# Patient Record
Sex: Female | Born: 1980 | Race: Black or African American | Hispanic: No | Marital: Married | Smoking: Never smoker
Health system: Southern US, Community
[De-identification: ages and names within clinical notes are randomized; demographics above are authoritative.]

## PROBLEM LIST (undated history)

## (undated) DIAGNOSIS — Z436 Encounter for attention to other artificial openings of urinary tract: Secondary | ICD-10-CM

## (undated) DIAGNOSIS — K219 Gastro-esophageal reflux disease without esophagitis: Secondary | ICD-10-CM

## (undated) DIAGNOSIS — R768 Other specified abnormal immunological findings in serum: Secondary | ICD-10-CM

## (undated) DIAGNOSIS — N3010 Interstitial cystitis (chronic) without hematuria: Secondary | ICD-10-CM

## (undated) DIAGNOSIS — Z9889 Other specified postprocedural states: Secondary | ICD-10-CM

## (undated) DIAGNOSIS — G35 Multiple sclerosis: Secondary | ICD-10-CM

## (undated) HISTORY — PX: CHOLECYSTECTOMY: SHX55

## (undated) HISTORY — PX: OTHER SURGICAL HISTORY: SHX169

## (undated) HISTORY — PX: OVARIAN CYST DRAINAGE: SHX325

---

## 2017-07-20 HISTORY — PX: CYSTECTOMY W/ URETEROILEAL CONDUIT: SUR361

## 2021-01-03 ENCOUNTER — Emergency Department: Payer: BC Managed Care – PPO

## 2021-01-03 ENCOUNTER — Emergency Department
Admission: EM | Admit: 2021-01-03 | Discharge: 2021-01-03 | Disposition: A | Discharge status: Home / Self Care | Payer: BC Managed Care – PPO | Attending: Emergency Medicine | Admitting: Emergency Medicine

## 2021-01-03 ENCOUNTER — Encounter: Payer: Self-pay | Admitting: Emergency Medicine

## 2021-01-03 ENCOUNTER — Other Ambulatory Visit: Payer: Self-pay

## 2021-01-03 DIAGNOSIS — K8020 Calculus of gallbladder without cholecystitis without obstruction: Secondary | ICD-10-CM | POA: Diagnosis not present

## 2021-01-03 DIAGNOSIS — K2900 Acute gastritis without bleeding: Secondary | ICD-10-CM | POA: Diagnosis not present

## 2021-01-03 DIAGNOSIS — R109 Unspecified abdominal pain: Secondary | ICD-10-CM | POA: Diagnosis present

## 2021-01-03 HISTORY — DX: Encounter for attention to other artificial openings of urinary tract: Z43.6

## 2021-01-03 HISTORY — DX: Multiple sclerosis: G35

## 2021-01-03 HISTORY — DX: Interstitial cystitis (chronic) without hematuria: N30.10

## 2021-01-03 LAB — CBC
HCT: 36.9 % (ref 36.0–46.0)
Hemoglobin: 12.2 g/dL (ref 12.0–15.0)
MCH: 30.3 pg (ref 26.0–34.0)
MCHC: 33.1 g/dL (ref 30.0–36.0)
MCV: 91.8 fL (ref 80.0–100.0)
Platelets: 247 10*3/uL (ref 150–400)
RBC: 4.02 MIL/uL (ref 3.87–5.11)
RDW: 12.7 % (ref 11.5–15.5)
WBC: 4.7 10*3/uL (ref 4.0–10.5)
nRBC: 0 % (ref 0.0–0.2)

## 2021-01-03 LAB — COMPREHENSIVE METABOLIC PANEL
ALT: 9 U/L (ref 0–44)
AST: 20 U/L (ref 15–41)
Albumin: 4.5 g/dL (ref 3.5–5.0)
Alkaline Phosphatase: 41 U/L (ref 38–126)
Anion gap: 10 (ref 5–15)
BUN: 15 mg/dL (ref 6–20)
CO2: 23 mmol/L (ref 22–32)
Calcium: 9.4 mg/dL (ref 8.9–10.3)
Chloride: 105 mmol/L (ref 98–111)
Creatinine, Ser: 0.84 mg/dL (ref 0.44–1.00)
GFR, Estimated: >60 mL/min (ref >60)
Glucose, Bld: 81 mg/dL (ref 70–99)
Potassium: 3.9 mmol/L (ref 3.5–5.1)
Sodium: 138 mmol/L (ref 135–145)
Total Bilirubin: 0.6 mg/dL (ref 0.3–1.2)
Total Protein: 8.5 g/dL — ABNORMAL HIGH (ref 6.5–8.1)

## 2021-01-03 LAB — LIPASE, BLOOD: Lipase: 34 U/L (ref 11–51)

## 2021-01-03 MED ORDER — PANTOPRAZOLE SODIUM 20 MG PO TBEC: 20.0000 mg | DELAYED_RELEASE_TABLET | Freq: Every day | ORAL | 0 refills | Status: DC

## 2021-01-03 MED ORDER — SUCRALFATE 1 G PO TABS: 1.0000 g | ORAL_TABLET | Freq: Three times a day (TID) | ORAL | 0 refills | Status: DC

## 2021-01-03 MED ORDER — OXYCODONE HCL 5 MG PO TABS: 5.0000 mg | ORAL_TABLET | Freq: Three times a day (TID) | ORAL | 0 refills | Status: AC | PRN

## 2021-01-03 MED ORDER — ONDANSETRON 4 MG PO TBDP: 4.0000 mg | ORAL_TABLET | Freq: Three times a day (TID) | ORAL | 0 refills | Status: DC | PRN

## 2021-01-03 NOTE — Discharge Instructions (Addendum)
We are treating you for possible gastritis.  Please stop taking your ibuprofen.  Take Tylenol 1 g every 8 hours for your pain.  Take Zofran to help with nausea, Carafate to help line your stomach, Protonix to help reduce the acid.  I have prescribed a few oxycodone for severe breakthrough pain but he cannot drive or work while on these.  Take MiraLAX to prevent constipation.  You should call the number listed above on Monday for a follow-up appointment.  States that you were seen in the ER need follow-up approved by Dr. Lady Gary for evaluation of your gallbladder.  IMPRESSION:  1. Moderate cholelithiasis and tumefactive sludge without evidence  of acute cholecystitis.

## 2021-01-03 NOTE — ED Triage Notes (Signed)
Pt brought over from [facility] for gallstones. Pt has appointment in April but the pain continues to happen intermittently.

## 2021-01-03 NOTE — ED Provider Notes (Signed)
[facility] Emergency Department Provider Note  ____________________________________________   Event Date/Time   First MD Initiated Contact with Patient 01/03/21 1117     (approximate)  I have reviewed the triage vital signs and the nursing notes.   HISTORY  Chief Complaint Abdominal Pain    HPI Roberta Dean is a 40 y.o. female with MS, gallstones who comes in for abdominal pain.  Patient states she had 3 months of upper abdominal pain.  She states the pain was intermittent but now seems to be more consistent.  This is been going on for more consistently for about a week.  She denies it being related to food.  She denies ever having this issue previously.  She does have some nausea but no vomiting.  To note patient does have MS and does have a urostomy bag.  She was seen by urology for this pain and had a work-up done and they did not think it was related to her urostomy.  She has no lower abdominal pain.  She otherwise denies any fevers, chest pain, shortness of breath.  Patient does state that she has been on NSAIDs for years and she takes ibuprofen daily.          Past Medical History:  Diagnosis Date  . Attention to urostomy [facility])   . Interstitial cystitis   . Multiple sclerosis (HCC)     There are no problems to display for this patient.   History reviewed. No pertinent surgical history.  Prior to Admission medications   Not on File    Allergies Patient has no allergy information on record.  No family history on file.  Social History      Review of Systems Constitutional: No fever/chills Eyes: No visual changes. ENT: No sore throat. Cardiovascular: Denies chest pain. Respiratory: Denies shortness of breath. Gastrointestinal: Upper abdominal pain, nausea . Genitourinary: Negative for dysuria. Musculoskeletal: Negative for back pain. Skin: Negative for rash. Neurological: Negative for headaches, focal weakness or  numbness. All other ROS negative ____________________________________________   PHYSICAL EXAM:  VITAL SIGNS: ED Triage Vitals  Enc Vitals Group     BP 01/03/21 0921 114/63     Pulse Rate 01/03/21 0918 98     Resp 01/03/21 0918 20     Temp 01/03/21 0918 98.2 F (36.8 C)     Temp Source 01/03/21 0918 Oral     SpO2 01/03/21 0918 100 %     Weight 01/03/21 0918 103 lb (46.7 kg)     Height 01/03/21 0918 5\' 1"  (1.549 m)     Head Circumference --      Peak Flow --      Pain Score 01/03/21 0918 7     Pain Loc --      Pain Edu? --      Excl. in GC? --     Constitutional: Alert and oriented. Well appearing and in no acute distress. Eyes: Conjunctivae are normal. EOMI. Head: Atraumatic. Nose: No congestion/rhinnorhea. Mouth/Throat: Mucous membranes are moist.   Neck: No stridor. Trachea Midline. FROM Cardiovascular: Normal rate, regular rhythm. Grossly normal heart sounds.  Good peripheral circulation. Respiratory: Normal respiratory effort.  No retractions. Lungs CTAB. Gastrointestinal: Soft very mild epigastric tenderness no distention. No abdominal bruits.  Urostomy bag in place Musculoskeletal: No lower extremity tenderness nor edema.  No joint effusions. Neurologic:  Normal speech and language. No gross focal neurologic deficits are appreciated.  Skin:  Skin is warm, dry and intact. No rash  noted. Psychiatric: Mood and affect are normal. Speech and behavior are normal. GU: Deferred   ____________________________________________   LABS (all labs ordered are listed, but only abnormal results are displayed)  Labs Reviewed  COMPREHENSIVE METABOLIC PANEL - Abnormal; Notable for the following components:      Result Value   Total Protein 8.5 (*)    All other components within normal limits  LIPASE, BLOOD  CBC   ____________________________________________   RADIOLOGY  Official radiology report(s): US ABDOMEN LIMITED RUQ (LIVER/GB)  Result Date: 01/03/2021 CLINICAL  DATA:  Evaluate for cholelithiasis. Epigastric pain intermittently 3 months. EXAM: ULTRASOUND ABDOMEN LIMITED RIGHT UPPER QUADRANT COMPARISON:  None. FINDINGS: Gallbladder: Multiple shadowing stones and tumefactive sludge. No wall thickening or pericholecystic fluid. Negative sonographic Murphy sign. Largest stone measures 12 mm. Common bile duct: Diameter: 2.9 mm. Liver: No focal lesion identified. Within normal limits in parenchymal echogenicity. Portal vein is patent on color Doppler imaging with normal direction of blood flow towards the liver. Other: Incidental finding of a 2.6 cm cyst over the upper pole right kidney. IMPRESSION: 1. Moderate cholelithiasis and tumefactive sludge without evidence of acute cholecystitis. 2.  2.6 cm upper pole right renal cyst. Electronically Signed   By: Elberta Fortis M.D.   On: 01/03/2021 11:01    ____________________________________________   PROCEDURES  Procedure(s) performed (including Critical Care):  Procedures   ____________________________________________   INITIAL IMPRESSION / ASSESSMENT AND PLAN / ED COURSE  Roberta Dean was evaluated in Emergency Department on 01/03/2021 for the symptoms described in the history of present illness. She was evaluated in the context of the global COVID-19 pandemic, which necessitated consideration that the patient might be at risk for infection with the SARS-CoV-2 virus that causes COVID-19. Institutional protocols and algorithms that pertain to the evaluation of patients at risk for COVID-19 are in a state of rapid change based on information released by regulatory bodies including the CDC and federal and state organizations. These policies and algorithms were followed during the patient's care in the ED.     Patient is a 40 year old who comes in with epigastric abdominal pain is been going on for a few months now.  Patient does take NSAIDs daily.  She states that she has been on ibuprofen for years.  This could  be secondary to gastritis and we discussed treatment options for this including discontinuing NSAIDs started on Tylenol, Carafate, PPI and Zofran.  Ultrasound was obtained that did show evidence of gallstones.  There is no evidence of cholecystitis and LFTs are normal so low suspicion for choledocholithiasis.  Patient is very well-appearing at this time with normal vital signs no white count elevation to suggest infection.  We discussed seeing surgery today versus going home and trialing treatment for gastritis.  I think that it would be best to trial some gastritis treatment to see if this is resolving her issues given not a clear cut gallbladder description of pain.  I did discuss with Dr. Lady Gary who will schedule her in her clinic in 1 week.  I discussed with patient that if her symptoms are worsening she should return to the ER.  Her abdomen exam is very reassuring I have low suspicion for other acute abdominal process such as SBO, perforation.  She also has no chest pain and this has been going for some months I do not think that this is cardiac in nature      ____________________________________________   FINAL CLINICAL IMPRESSION(S) / ED DIAGNOSES   Final diagnoses:  Gallstone  Acute gastritis without hemorrhage, unspecified gastritis type      MEDICATIONS GIVEN DURING THIS VISIT:  Medications - No data to display   ED Discharge Orders         Ordered    sucralfate (CARAFATE) 1 g tablet  3 times daily with meals & bedtime        01/03/21 1137    ondansetron (ZOFRAN ODT) 4 MG disintegrating tablet  Every 8 hours PRN        01/03/21 1137    pantoprazole (PROTONIX) 20 MG tablet  Daily        01/03/21 1137    oxyCODONE (ROXICODONE) 5 MG immediate release tablet  Every 8 hours PRN        01/03/21 1137           Note:  This document was prepared using Dragon voice recognition software and may include unintentional dictation errors.   Concha Se, MD 01/03/21 1147

## 2021-01-13 ENCOUNTER — Other Ambulatory Visit: Payer: Self-pay

## 2021-01-13 ENCOUNTER — Encounter: Payer: Self-pay | Admitting: General Surgery

## 2021-01-13 ENCOUNTER — Ambulatory Visit (INDEPENDENT_AMBULATORY_CARE_PROVIDER_SITE_OTHER): Payer: BC Managed Care – PPO | Admitting: General Surgery

## 2021-01-13 VITALS — BP 121/75 | HR 87 | Temp 98.3°F | Ht 61.0 in | Wt 107.0 lb

## 2021-01-13 DIAGNOSIS — K2970 Gastritis, unspecified, without bleeding: Secondary | ICD-10-CM

## 2021-01-13 DIAGNOSIS — K2990 Gastroduodenitis, unspecified, without bleeding: Secondary | ICD-10-CM | POA: Diagnosis not present

## 2021-01-13 MED ORDER — PANTOPRAZOLE SODIUM 20 MG PO TBEC: 20.0000 mg | DELAYED_RELEASE_TABLET | Freq: Every day | ORAL | 1 refills | Status: DC

## 2021-01-13 MED ORDER — SUCRALFATE 1 G PO TABS: 1.0000 g | ORAL_TABLET | Freq: Three times a day (TID) | ORAL | 1 refills | Status: DC

## 2021-01-13 NOTE — Patient Instructions (Addendum)
Try eating several small meals a day instead of large meals.   Continue with your medication regimen, follow up with your primary care provider for any refills. Call us if you start to experience more symptoms and would like to consider gallbladder removal.   Gastritis, Adult Gastritis is inflammation of the stomach. There are two kinds of gastritis:  Acute gastritis. This kind develops suddenly.  Chronic gastritis. This kind is much more common and lasts for a long time. Gastritis happens when the lining of the stomach becomes weak or gets damaged. Without treatment, gastritis can lead to stomach bleeding and ulcers. What are the causes? This condition may be caused by:  An infection.  Drinking too much alcohol.  Certain medicines. These include steroids, antibiotics, and some over-the-counter medicines, such as aspirin or ibuprofen.  Having too much acid in the stomach.  A disease of the intestines or stomach.  Stress.  An allergic reaction.  Crohn's disease.  Some cancer treatments (radiation). Sometimes the cause of this condition is not known. What are the signs or symptoms? Symptoms of this condition include:  Pain or a burning sensation in the upper abdomen.  Nausea.  Vomiting.  An uncomfortable feeling of fullness after eating.  Weight loss.  Bad breath.  Blood in your vomit or stools. In some cases, there are no symptoms. How is this diagnosed? This condition may be diagnosed with:  Your medical history and a description of your symptoms.  A physical exam.  Tests. These can include: ? Blood tests. ? Stool tests. ? A test in which a thin, flexible instrument with a light and a camera is passed down the esophagus and into the stomach (upper endoscopy). ? A test in which a sample of tissue is taken for testing (biopsy). How is this treated? This condition may be treated with medicines. The medicines that are used vary depending on the cause of the  gastritis:  If the condition is caused by a bacterial infection, you may be given antibiotic medicines.  If the condition is caused by too much acid in the stomach, you may be given medicines called H2 blockers, proton pump inhibitors, or antacids. Treatment may also involve stopping the use of certain medicines, such as aspirin, ibuprofen, or other NSAIDs. Follow these instructions at home: Medicines  Take over-the-counter and prescription medicines only as told by your health care provider.  If you were prescribed an antibiotic medicine, take it as told by your health care provider. Do not stop taking the antibiotic even if you start to feel better. Eating and drinking  Eat small, frequent meals instead of large meals.  Avoid foods and drinks that make your symptoms worse.  Drink enough fluid to keep your urine pale yellow.   Alcohol use  Do not drink alcohol if: ? Your health care provider tells you not to drink. ? You are pregnant, may be pregnant, or are planning to become pregnant.  If you drink alcohol: ? Limit your use to:  0-1 drink a day for women.  0-2 drinks a day for men. ? Be aware of how much alcohol is in your drink. In the [place] one drink equals one 12 oz bottle of beer (355 mL), one 5 oz glass of wine (148 mL), or one 1 oz glass of hard liquor (44 mL). General instructions  Talk with your health care provider about ways to manage stress, such as getting regular exercise or practicing deep breathing, meditation, or yoga.  Do  not use any products that contain nicotine or tobacco, such as cigarettes and e-cigarettes. If you need help quitting, ask your health care provider.  Keep all follow-up visits as told by your health care provider. This is important. Contact a health care provider if:  Your symptoms get worse.  Your symptoms return after treatment. Get help right away if:  You vomit blood or material that looks like coffee grounds.  You have black  or dark red stools.  You are unable to keep fluids down.  Your abdominal pain gets worse.  You have a fever.  You do not feel better after one week. Summary  Gastritis is inflammation of the lining of the stomach that can occur suddenly (acute) or develop slowly over time (chronic).  This condition is diagnosed with a medical history, a physical exam, or tests.  This condition may be treated with medicines to treat infection or medicines to reduce the amount of acid in your stomach.  Follow your health care provider's instructions about taking medicines, making changes to your diet, and knowing when to call for help. This information is not intended to replace advice given to you by your health care provider. Make sure you discuss any questions you have with your health care provider. Document Revised: 04/25/2018 Document Reviewed: 04/25/2018 Elsevier Patient Education  2021 [employer].

## 2021-01-15 ENCOUNTER — Encounter: Payer: Self-pay | Admitting: General Surgery

## 2021-01-15 NOTE — Progress Notes (Signed)
Patient ID: Roberta Dean, female   DOB: 1981-12-11, 40 y.o.   MRN: [id number]  No chief complaint on file.   HPI Roberta Dean is a 40 y.o. female.   He is here today for follow-up from an emergency department visit of January 03, 2021.  She had presented with 3 months of upper abdominal pain.  There was no associated nausea or vomiting.  She says that the pain is worse when she does not eat, rather than being triggered by food.  She initially thought it might have been related to her ileal conduit, but was evaluated by urology and this was felt not to be the source.  She does take NSAIDs on a daily basis and has done so for many years.  Evaluation in the emergency department did include a right upper quadrant ultrasound.  This showed cholelithiasis and sludge, but no signs concerning for cholecystitis.  Labs obtained in the emergency department did not show any evidence of infection (normal white blood cell count) nor any suggestion of biliary obstruction.  Gastritis was felt to be on the differential diagnosis and she was prescribed pantoprazole and sucralfate.  She states that since initiating this medication regimen, her pain has improved.  She is here today for follow-up.  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  This was done at [facility].   Past Medical History:  Diagnosis Date  . Attention to urostomy [facility])   . Interstitial cystitis   . Multiple sclerosis (HCC)     Past Surgical History:  Procedure Laterality Date  . CYSTECTOMY W/ URETEROILEAL CONDUIT  07/2017   [facility]    Family History  Problem Relation Age of Onset  . Hypertension Mother   . Lung cancer Father 75    Social History Social History   Tobacco Use  . Smoking status: Never Smoker  . Smokeless tobacco: Never Used  Vaping Use  . Vaping Use: Never used  Substance Use  Topics  . Alcohol use: Never  . Drug use: Never    No Known Allergies  Current Outpatient Medications  Medication Sig Dispense Refill  . ergocalciferol (VITAMIN D2) 1.25 MG (50000 UT) capsule Take by mouth.    . gabapentin (NEURONTIN) 300 MG capsule TAKE 1 TO 2 CAPSULES BY MOUTH AT BEDTIME    . nitrofurantoin (MACRODANTIN) 50 MG capsule Take 50 mg by mouth at bedtime.    . ondansetron (ZOFRAN ODT) 4 MG disintegrating tablet Take 1 tablet (4 mg total) by mouth every 8 (eight) hours as needed for nausea or vomiting. 20 tablet 0  . pantoprazole (PROTONIX) 20 MG tablet Take 1 tablet (20 mg total) by mouth daily. 30 tablet 1  . sucralfate (CARAFATE) 1 g tablet Take 1 tablet (1 g total) by mouth 4 (four) times daily -  with meals and at bedtime. 120 tablet 1   No current facility-administered medications for this visit.    Review of Systems Review of Systems  Constitutional: Positive for unexpected weight change.  HENT: Positive for tinnitus.   Gastrointestinal: Positive for abdominal pain and constipation.       GERD  Neurological:       Multiple sclerosis  All other systems reviewed and are negative.   Blood pressure 121/75, pulse 87, temperature 98.3 F (36.8 C), height 5\' 1"  (1.549 m), weight 107 lb (48.5 kg), last menstrual period 12/17/2020, SpO2 98 %.  Body mass index is 20.22 kg/m.  Physical Exam Physical Exam Constitutional:      General: She is not in acute distress.    Appearance: Normal appearance.     Comments: Extremely petite and thin.  HENT:     Head: Normocephalic and atraumatic.     Nose:     Comments: Covered with a mask    Mouth/Throat:     Comments: Covered with a mask Eyes:     General: No scleral icterus.       Right eye: No discharge.        Left eye: No discharge.     Conjunctiva/sclera: Conjunctivae normal.  Neck:     Comments: No palpable cervical or supraclavicular lymphadenopathy.  The trachea is midline.  There are no dominant thyroid masses  or thyromegaly appreciated.  The gland moves freely with deglutition. Cardiovascular:     Rate and Rhythm: Normal rate and regular rhythm.     Pulses: Normal pulses.  Pulmonary:     Effort: Pulmonary effort is normal.     Breath sounds: Normal breath sounds.  Abdominal:     General: Abdomen is flat.     Palpations: Abdomen is soft.       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  Clear yellow urine.  There is mild tenderness to deep palpation in the epigastric area.  Murphy sign is negative.  Genitourinary:    Comments: Deferred Musculoskeletal:        General: No swelling or tenderness.  Skin:    General: Skin is warm and dry.  Neurological:     General: No focal deficit present.     Mental Status: She is alert and oriented to person, place, and time.  Psychiatric:        Mood and Affect: Mood normal.        Behavior: Behavior normal.     Data Reviewed Results for Roberta Dean (MRN [id number]) as of 01/15/2021 13:14  Ref. Range 01/03/2021 09:26  COMPREHENSIVE METABOLIC PANEL Unknown Rpt (A)  Sodium Latest Ref Range: 135 - 145 mmol/L 138  Potassium Latest Ref Range: 3.5 - 5.1 mmol/L 3.9  Chloride Latest Ref Range: 98 - 111 mmol/L 105  CO2 Latest Ref Range: 22 - 32 mmol/L 23  Glucose Latest Ref Range: 70 - 99 mg/dL 81  BUN Latest Ref Range: 6 - 20 mg/dL 15  Creatinine Latest Ref Range: 0.44 - 1.00 mg/dL 3.38  Calcium Latest Ref Range: 8.9 - 10.3 mg/dL 9.4  Anion gap Latest Ref Range: 5 - 15  10  Alkaline Phosphatase Latest Ref Range: 38 - 126 U/L 41  Albumin Latest Ref Range: 3.5 - 5.0 g/dL 4.5  Lipase Latest Ref Range: 11 - 51 U/L 34  AST Latest Ref Range: 15 - 41 U/L 20  ALT Latest Ref Range: 0 - 44 U/L 9  Total Protein Latest Ref Range: 6.5 - 8.1 g/dL 8.5 (H)  Total Bilirubin Latest Ref Range: 0.3 - 1.2 mg/dL 0.6  GFR, Estimated Latest Ref Range: >60 mL/min >60  WBC Latest Ref Range: 4.0 - 10.5 K/uL 4.7  RBC Latest Ref Range: 3.87 -  5.11 MIL/uL 4.02  Hemoglobin Latest Ref Range: 12.0 - 15.0 g/dL 25.0  HCT Latest Ref Range: 36.0 - 46.0 % 36.9  MCV Latest Ref Range: 80.0 - 100.0 fL 91.8  MCH Latest Ref Range: 26.0 - 34.0 pg 30.3  MCHC Latest Ref Range: 30.0 - 36.0 g/dL 33.1  RDW Latest Ref Range: 11.5 - 15.5 % 12.7  Platelets Latest Ref Range: 150 - 400 K/uL 247  nRBC Latest Ref Range: 0.0 - 0.2 % 0.0  As discussed in the history of present illness.  These labs are normal and do not suggest biliary obstruction, hepatic inflammation, or any concern for infection.  I reviewed the ultrasound performed in the emergency room and concur with the radiology interpretation copied here:  CLINICAL DATA:  Evaluate for cholelithiasis. Epigastric pain intermittently 3 months.  EXAM: ULTRASOUND ABDOMEN LIMITED RIGHT UPPER QUADRANT  COMPARISON:  None.  FINDINGS: Gallbladder:  Multiple shadowing stones and tumefactive sludge. No wall thickening or pericholecystic fluid. Negative sonographic Murphy sign. Largest stone measures 12 mm.  Common bile duct:  Diameter: 2.9 mm.  Liver:  No focal lesion identified. Within normal limits in parenchymal echogenicity. Portal vein is patent on color Doppler imaging with normal direction of blood flow towards the liver.  Other: Incidental finding of a 2.6 cm cyst over the upper pole right kidney.  IMPRESSION: 1. Moderate cholelithiasis and tumefactive sludge without evidence of acute cholecystitis.  2.  2.6 cm upper pole right renal cyst.  Assessment This is a 40 year old woman with past medical history notable for cystectomy with ileal conduit creation, as well as multiple sclerosis.  Her epigastric pain seems more consistent with NSAID related gastritis versus peptic ulcer disease, given it is worse when she does not eat and has improved with addition of a proton pump inhibitor and sucralfate.  While she does have sludge and stones in her gallbladder, the history and  her symptoms are not suggestive of symptomatic cholelithiasis or biliary dyskinesia.  There is no evidence of cholecystitis on her imaging, nor on exam.    Plan For now, she wishes to defer any surgical intervention.  I gave her a refill on her pantoprazole and sucralfate.  She should continue to avoid NSAIDs and aspirin.  She may benefit from upper endoscopy, but at this time we have not placed that referral.  If she continues to have symptoms, I think this would likely be the first place to start in evaluating the source of her discomfort, however, should she develop symptoms more consistent with a gallbladder process, we are certainly willing to see her again to discuss surgical intervention.  Of note, the presence of the ileal conduit may complicate things and make an operative approach somewhat more challenging, but I think we will be able to work around this, should it become pertinent.    Duanne Guess 01/15/2021, 9:04 AM

## 2021-08-07 ENCOUNTER — Ambulatory Visit
Admission: RE | Admit: 2021-08-07 | Discharge: 2021-08-07 | Disposition: A | Discharge status: Home / Self Care | Payer: BC Managed Care – PPO | Attending: Gastroenterology | Admitting: Gastroenterology

## 2021-08-07 ENCOUNTER — Ambulatory Visit: Payer: BC Managed Care – PPO | Admitting: Anesthesiology

## 2021-08-07 ENCOUNTER — Encounter: Payer: Self-pay | Admitting: *Deleted

## 2021-08-07 ENCOUNTER — Encounter
Admission: RE | Disposition: A | Discharge status: Home / Self Care | Payer: Self-pay | Source: Home / Self Care | Attending: Gastroenterology

## 2021-08-07 DIAGNOSIS — Z791 Long term (current) use of non-steroidal anti-inflammatories (NSAID): Secondary | ICD-10-CM | POA: Diagnosis not present

## 2021-08-07 DIAGNOSIS — K219 Gastro-esophageal reflux disease without esophagitis: Secondary | ICD-10-CM | POA: Diagnosis not present

## 2021-08-07 DIAGNOSIS — R1013 Epigastric pain: Secondary | ICD-10-CM | POA: Insufficient documentation

## 2021-08-07 DIAGNOSIS — K5900 Constipation, unspecified: Secondary | ICD-10-CM | POA: Diagnosis not present

## 2021-08-07 DIAGNOSIS — K2950 Unspecified chronic gastritis without bleeding: Secondary | ICD-10-CM | POA: Diagnosis not present

## 2021-08-07 DIAGNOSIS — Z79899 Other long term (current) drug therapy: Secondary | ICD-10-CM | POA: Diagnosis not present

## 2021-08-07 DIAGNOSIS — G35 Multiple sclerosis: Secondary | ICD-10-CM | POA: Insufficient documentation

## 2021-08-07 HISTORY — DX: Gastro-esophageal reflux disease without esophagitis: K21.9

## 2021-08-07 HISTORY — PX: ESOPHAGOGASTRODUODENOSCOPY (EGD) WITH PROPOFOL: SHX5813

## 2021-08-07 LAB — POCT PREGNANCY, URINE: Preg Test, Ur: NEGATIVE

## 2021-08-07 SURGERY — ESOPHAGOGASTRODUODENOSCOPY (EGD) WITH PROPOFOL
Anesthesia: General

## 2021-08-07 MED ORDER — PROPOFOL 10 MG/ML IV BOLUS
INTRAVENOUS | Status: DC | PRN
  Administered 2021-08-07: 30 mg via INTRAVENOUS
  Administered 2021-08-07: 70 mg via INTRAVENOUS

## 2021-08-07 MED ORDER — SODIUM CHLORIDE 0.9 % IV SOLN: INTRAVENOUS | Status: DC

## 2021-08-07 MED ORDER — PROPOFOL 500 MG/50ML IV EMUL
INTRAVENOUS | Status: DC | PRN
  Administered 2021-08-07: 150 ug/kg/min via INTRAVENOUS

## 2021-08-07 MED ORDER — ONDANSETRON HCL 4 MG/2ML IJ SOLN
INTRAMUSCULAR | Status: DC | PRN
  Administered 2021-08-07: 4 mg via INTRAVENOUS

## 2021-08-07 MED ORDER — DEXMEDETOMIDINE (PRECEDEX) IN NS 20 MCG/5ML (4 MCG/ML) IV SYRINGE
PREFILLED_SYRINGE | INTRAVENOUS | Status: DC | PRN
  Administered 2021-08-07: 12 ug via INTRAVENOUS

## 2021-08-07 MED ORDER — LIDOCAINE HCL (CARDIAC) PF 100 MG/5ML IV SOSY
PREFILLED_SYRINGE | INTRAVENOUS | Status: DC | PRN
  Administered 2021-08-07: 50 mg via INTRAVENOUS

## 2021-08-07 NOTE — H&P (Signed)
Outpatient short stay form Pre-procedure 08/07/2021  Regis Bill, MD  Primary Physician: Lorenso Quarry, NP  Reason for visit:  Epigastric pain  History of present illness:   40 y/o lady with MS here for epigastric pain. History of ileal conduit. No blood thinners. On NSAIDS previously. Pain feels constant now. Worse when she doesn't eat. Has issues with constipation.    Current Facility-Administered Medications:    0.9 %  sodium chloride infusion, , Intravenous, Continuous, Ly Bacchi, Rossie Muskrat, MD, Last Rate: 20 mL/hr at 08/07/21 1239, New Bag at 08/07/21 1239  Medications Prior to Admission  Medication Sig Dispense Refill Last Dose   calcium-vitamin D (OSCAL WITH D) 500-200 MG-UNIT tablet Take 1 tablet by mouth.      cyanocobalamin 1000 MCG tablet Take 1,000 mcg by mouth daily.      dicyclomine (BENTYL) 10 MG/5ML solution Take by mouth 4 (four) times daily -  before meals and at bedtime.      ibuprofen (ADVIL) 200 MG tablet Take 200 mg by mouth every 6 (six) hours as needed.   08/06/2021   linaclotide (LINZESS) 72 MCG capsule Take 72 mcg by mouth daily before breakfast.      ergocalciferol (VITAMIN D2) 1.25 MG (50000 UT) capsule Take by mouth.      gabapentin (NEURONTIN) 300 MG capsule TAKE 1 TO 2 CAPSULES BY MOUTH AT BEDTIME      nitrofurantoin (MACRODANTIN) 50 MG capsule Take 50 mg by mouth at bedtime.      ondansetron (ZOFRAN ODT) 4 MG disintegrating tablet Take 1 tablet (4 mg total) by mouth every 8 (eight) hours as needed for nausea or vomiting. 20 tablet 0    pantoprazole (PROTONIX) 20 MG tablet Take 1 tablet (20 mg total) by mouth daily. 30 tablet 1    sucralfate (CARAFATE) 1 g tablet Take 1 tablet (1 g total) by mouth 4 (four) times daily -  with meals and at bedtime. 120 tablet 1      No Known Allergies   Past Medical History:  Diagnosis Date   Attention to urostomy [facility])    GERD (gastroesophageal reflux disease)    Interstitial cystitis    Multiple sclerosis  (HCC)     Review of systems:  Otherwise negative.    Physical Exam  Gen: Alert, oriented. Appears stated age.  HEENT: PERRLA. Lungs: No respiratory distress CV: RRR Abd: soft, benign, no masses Ext: No edema    Planned procedures: Proceed with EGD. The patient understands the nature of the planned procedure, indications, risks, alternatives and potential complications including but not limited to bleeding, infection, perforation, damage to internal organs and possible oversedation/side effects from anesthesia. The patient agrees and gives consent to proceed.  Please refer to procedure notes for findings, recommendations and patient disposition/instructions.     Regis Bill, MD [facility]

## 2021-08-07 NOTE — Anesthesia Preprocedure Evaluation (Signed)
Anesthesia Evaluation  Patient identified by MRN, date of birth, ID band Patient awake    Reviewed: Allergy & Precautions, NPO status , Patient's Chart, lab work & pertinent test results  History of Anesthesia Complications Negative for: history of anesthetic complications  Airway Mallampati: III  TM Distance: >3 FB Neck ROM: full    Dental  (+) Chipped   Pulmonary neg pulmonary ROS, neg shortness of breath,    Pulmonary exam normal        Cardiovascular Exercise Tolerance: Good negative cardio ROS Normal cardiovascular exam     Neuro/Psych  Neuromuscular disease negative psych ROS   GI/Hepatic Neg liver ROS, GERD  Medicated and Controlled,  Endo/Other  negative endocrine ROS  Renal/GU negative Renal ROS  negative genitourinary   Musculoskeletal   Abdominal   Peds  Hematology negative hematology ROS (+)   Anesthesia Other Findings Past Medical History: No date: Attention to urostomy [facility]) No date: GERD (gastroesophageal reflux disease) No date: Interstitial cystitis No date: Multiple sclerosis [facility])  Past Surgical History: 07/2017: CYSTECTOMY W/ URETEROILEAL CONDUIT     Comment:  [facility]  BMI    Body Mass Index: 19.65 kg/m      Reproductive/Obstetrics negative OB ROS                             Anesthesia Physical Anesthesia Plan  ASA: 3  Anesthesia Plan: General   Post-op Pain Management:    Induction: Intravenous  PONV Risk Score and Plan: Propofol infusion and TIVA  Airway Management Planned: Natural Airway and Nasal Cannula  Additional Equipment:   Intra-op Plan:   Post-operative Plan:   Informed Consent: I have reviewed the patients History and Physical, chart, labs and discussed the procedure including the risks, benefits and alternatives for the proposed anesthesia with the patient or authorized representative who has indicated  his/her understanding and acceptance.     Dental Advisory Given  Plan Discussed with: Anesthesiologist, CRNA and Surgeon  Anesthesia Plan Comments: (Patient consented for risks of anesthesia including but not limited to:  - adverse reactions to medications - risk of airway placement if required - damage to eyes, teeth, lips or other oral mucosa - nerve damage due to positioning  - sore throat or hoarseness - Damage to heart, brain, nerves, lungs, other parts of body or loss of life  Patient voiced understanding.)        Anesthesia Quick Evaluation

## 2021-08-07 NOTE — Transfer of Care (Signed)
Immediate Anesthesia Transfer of Care Note  Patient: Roberta Dean  Procedure(s) Performed: ESOPHAGOGASTRODUODENOSCOPY (EGD) WITH PROPOFOL  Patient Location: PACU  Anesthesia Type:General  Level of Consciousness: drowsy  Airway & Oxygen Therapy: Patient Spontanous Breathing  Post-op Assessment: Report given to RN and Post -op Vital signs reviewed and stable  Post vital signs: Reviewed and stable  Last Vitals:  Vitals Value Taken Time  BP 110/74 08/07/21 1300  Temp 36.7 C 08/07/21 1226  Pulse 84 08/07/21 1300  Resp 20 08/07/21 1300  SpO2 100 % 08/07/21 1300    Last Pain:  Vitals:   08/07/21 1226  TempSrc: Temporal  PainSc: 8          Complications: No notable events documented.

## 2021-08-07 NOTE — Anesthesia Procedure Notes (Signed)
Date/Time: 08/07/2021 12:44 PM Performed by: Ginger Carne, CRNA Pre-anesthesia Checklist: Patient identified, Emergency Drugs available, Suction available and Patient being monitored Patient Re-evaluated:Patient Re-evaluated prior to induction Oxygen Delivery Method: Nasal cannula Preoxygenation: Pre-oxygenation with 100% oxygen Induction Type: IV induction

## 2021-08-07 NOTE — Interval H&P Note (Signed)
History and Physical Interval Note:  08/07/2021 12:41 PM  Roberta Dean  has presented today for surgery, with the diagnosis of UPPER ABD PAIN.  The various methods of treatment have been discussed with the patient and family. After consideration of risks, benefits and other options for treatment, the patient has consented to  Procedure(s): ESOPHAGOGASTRODUODENOSCOPY (EGD) WITH PROPOFOL (N/A) as a surgical intervention.  The patient's history has been reviewed, patient examined, no change in status, stable for surgery.  I have reviewed the patient's chart and labs.  Questions were answered to the patient's satisfaction.     Regis Bill  Ok to proceed with EGD

## 2021-08-07 NOTE — Op Note (Signed)
[facility] Patient Name: Roberta Dean Procedure Date: 08/07/2021 12:42 PM MRN: [id number] Account #: [id number] Date of Birth: Aug 29, 1981 Admit Type: Outpatient Age: 40 Room: [facility] ENDO ROOM 3 Gender: Female Note Status: Finalized Procedure:             Upper GI endoscopy Indications:           Epigastric abdominal pain Providers:             Andrey Farmer MD, MD Referring MD:          No Local Md, MD (Referring MD) Medicines:             Monitored Anesthesia Care Complications:         No immediate complications. Estimated blood loss:                         Minimal. Procedure:             Pre-Anesthesia Assessment:                        - Prior to the procedure, a History and Physical was                         performed, and patient medications and allergies were                         reviewed. The patient is competent. The risks and                         benefits of the procedure and the sedation options and                         risks were discussed with the patient. All questions                         were answered and informed consent was obtained.                         Patient identification and proposed procedure were                         verified by the physician, the nurse, the anesthetist                         and the technician in the endoscopy suite. Mental                         Status Examination: alert and oriented. Airway                         Examination: normal oropharyngeal airway and neck                         mobility. Respiratory Examination: clear to                         auscultation. CV Examination: normal. Prophylactic  Antibiotics: The patient does not require prophylactic                         antibiotics. Prior Anticoagulants: The patient has                         taken no previous anticoagulant or antiplatelet                         agents. ASA Grade  Assessment: III - A patient with                         severe systemic disease. After reviewing the risks and                         benefits, the patient was deemed in satisfactory                         condition to undergo the procedure. The anesthesia                         plan was to use monitored anesthesia care (MAC).                         Immediately prior to administration of medications,                         the patient was re-assessed for adequacy to receive                         sedatives. The heart rate, respiratory rate, oxygen                         saturations, blood pressure, adequacy of pulmonary                         ventilation, and response to care were monitored                         throughout the procedure. The physical status of the                         patient was re-assessed after the procedure.                        After obtaining informed consent, the endoscope was                         passed under direct vision. Throughout the procedure,                         the patient's blood pressure, pulse, and oxygen                         saturations were monitored continuously. The Endoscope                         was introduced through the mouth, and advanced to the  second part of duodenum. The upper GI endoscopy was                         accomplished without difficulty. The patient tolerated                         the procedure well. Findings:      The examined esophagus was normal.      The entire examined stomach was normal. Biopsies were taken with a cold       forceps for Helicobacter pylori testing. Estimated blood loss was       minimal.      The examined duodenum was normal. Impression:            - Normal esophagus.                        - Normal stomach. Biopsied.                        - Normal examined duodenum. Recommendation:        - Discharge patient to home.                        - Resume  previous diet.                        - Continue present medications.                        - Await pathology results.                        - Return to referring physician as previously                         scheduled. Procedure Code(s):     --- Professional ---                        [phone], Esophagogastroduodenoscopy, flexible,                         transoral; with biopsy, single or multiple Diagnosis Code(s):     --- Professional ---                        R10.13, Epigastric pain CPT copyright 2019 [employer]. All rights reserved. The codes documented in this report are preliminary and upon coder review may  be revised to meet current compliance requirements. Andrey Farmer MD, MD 08/07/2021 12:55:32 PM Number of Addenda: 0 Note Initiated On: 08/07/2021 12:42 PM Estimated Blood Loss:  Estimated blood loss was minimal.      [facility]

## 2021-08-10 ENCOUNTER — Encounter: Payer: Self-pay | Admitting: Gastroenterology

## 2021-08-10 LAB — SURGICAL PATHOLOGY

## 2021-08-10 NOTE — Anesthesia Postprocedure Evaluation (Signed)
Anesthesia Post Note  Patient: Roberta Dean  Procedure(s) Performed: ESOPHAGOGASTRODUODENOSCOPY (EGD) WITH PROPOFOL  Patient location during evaluation: Endoscopy Anesthesia Type: General Level of consciousness: awake and alert Pain management: pain level controlled Vital Signs Assessment: post-procedure vital signs reviewed and stable Respiratory status: spontaneous breathing, nonlabored ventilation, respiratory function stable and patient connected to nasal cannula oxygen Cardiovascular status: blood pressure returned to baseline and stable Postop Assessment: no apparent nausea or vomiting Anesthetic complications: no   No notable events documented.   Last Vitals:  Vitals:   08/07/21 1320 08/07/21 1330  BP: 106/68 98/71  Pulse: 76 74  Resp: 12 10  Temp:    SpO2: 100% 100%    Last Pain:  Vitals:   08/09/21 1343  TempSrc:   PainSc: 0-No pain                 Cleda Mccreedy Debany Vantol

## 2021-09-15 ENCOUNTER — Encounter: Payer: Self-pay | Admitting: General Surgery

## 2022-04-05 ENCOUNTER — Other Ambulatory Visit: Payer: Self-pay | Admitting: Student

## 2022-04-05 DIAGNOSIS — G8929 Other chronic pain: Secondary | ICD-10-CM

## 2022-09-27 IMAGING — US US ABDOMEN LIMITED
1 series · 14 of 25 positions shown · non-contrast
Comparison: None.

CLINICAL DATA: Evaluate for cholelithiasis. Epigastric pain
intermittently 3 months.

EXAM:
ULTRASOUND ABDOMEN LIMITED RIGHT UPPER QUADRANT

[Series 1: us abdomen limited ruq (liver/gb) · 14 of 62 slices shown]
[im 1/62]
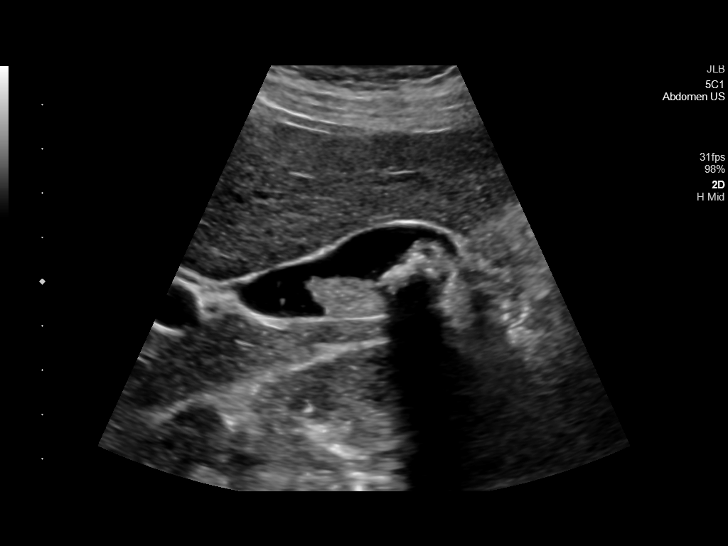
[im 6/62]
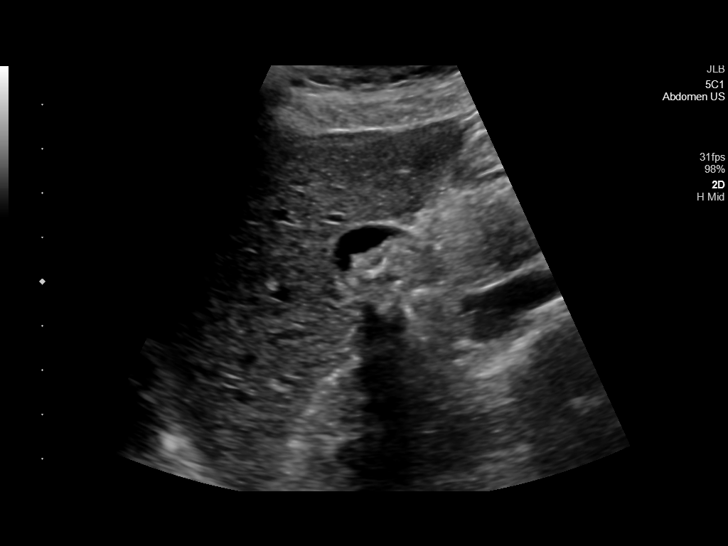
[im 11/62]
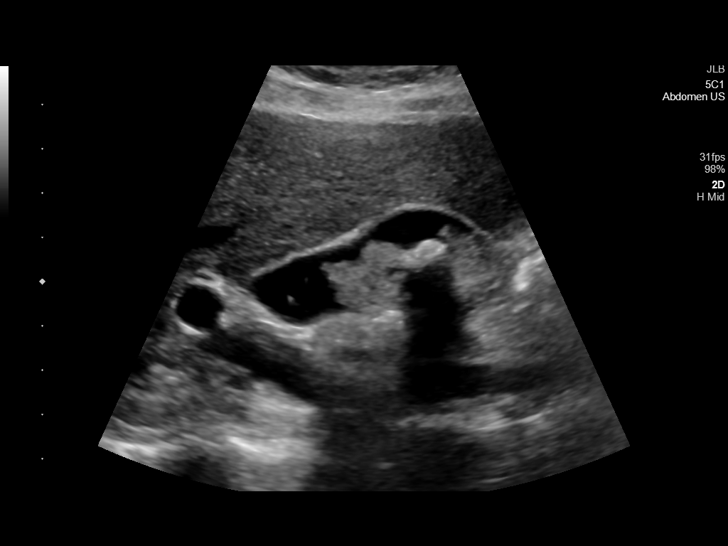
[im 16/62]
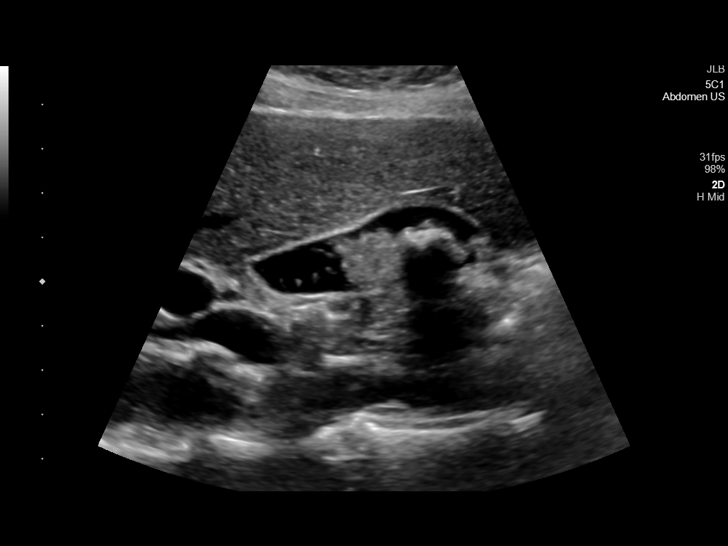
[im 21/62]
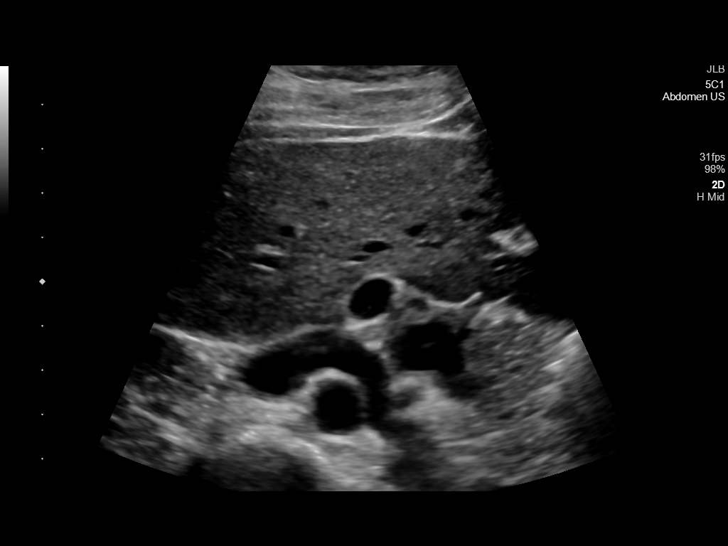
[im 23/62]
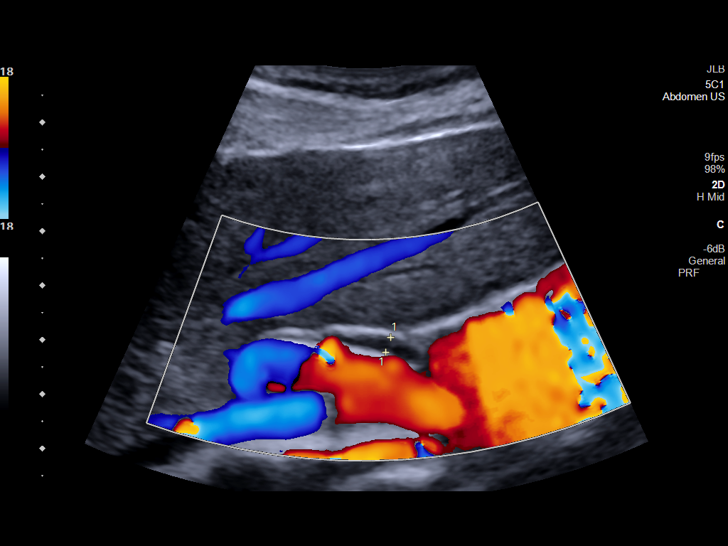
[im 28/62]
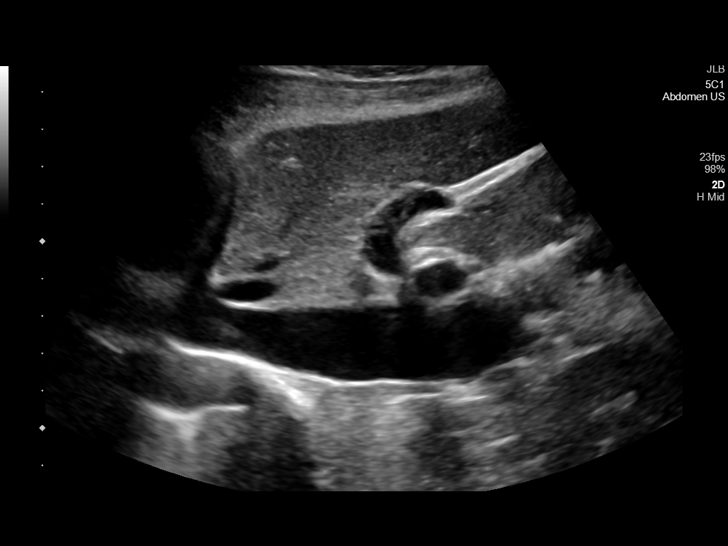
[im 34/62]
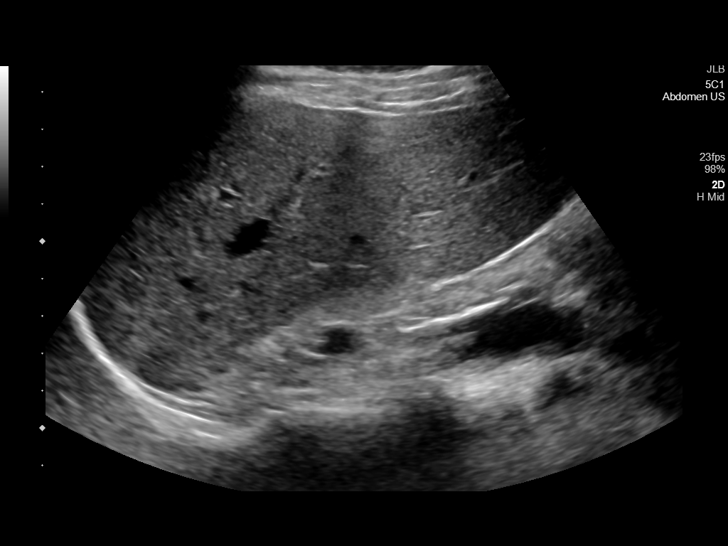
[im 39/62]
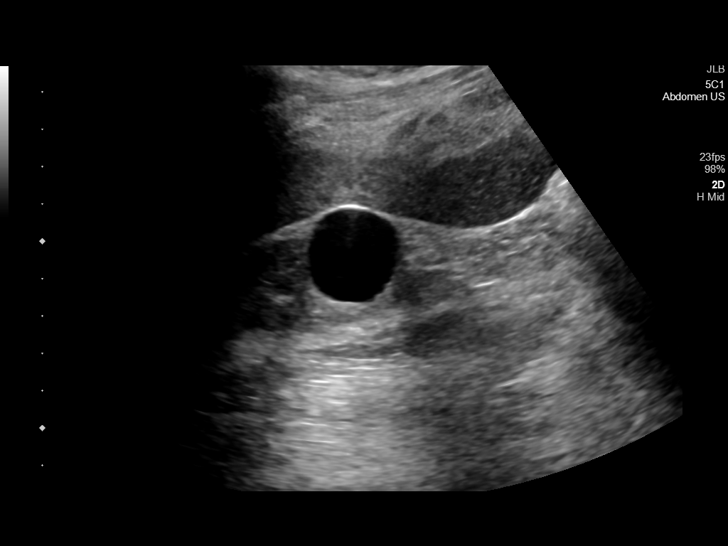
[im 41/62]
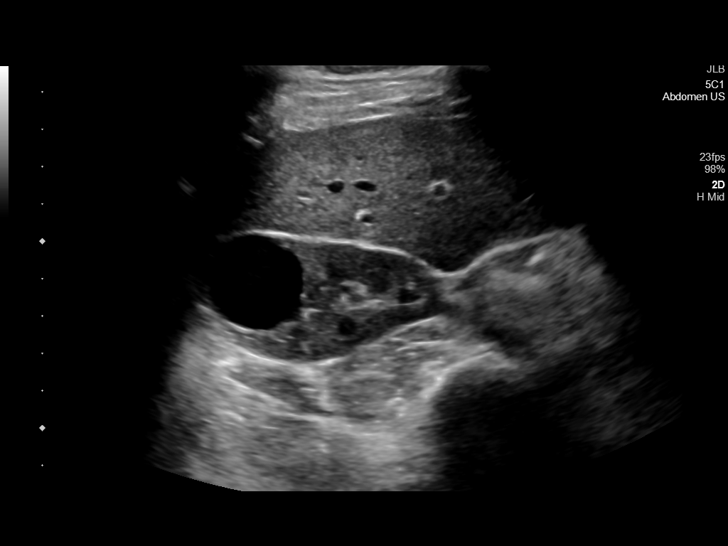
[im 46/62]
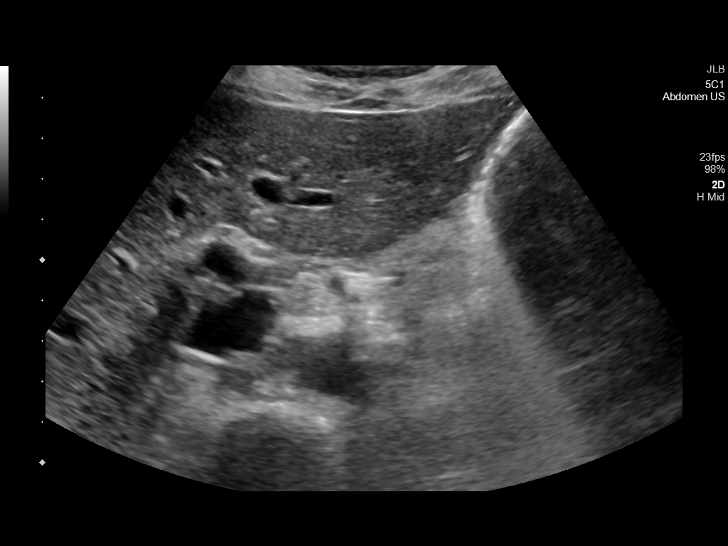
[im 51/62]
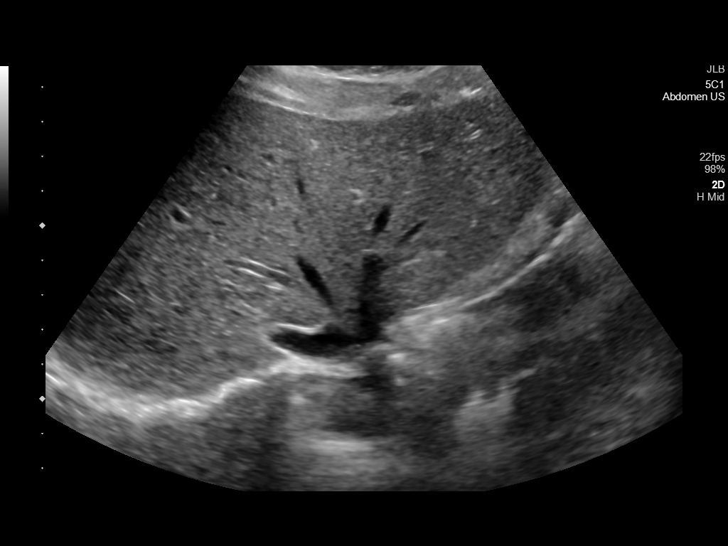
[im 56/62]
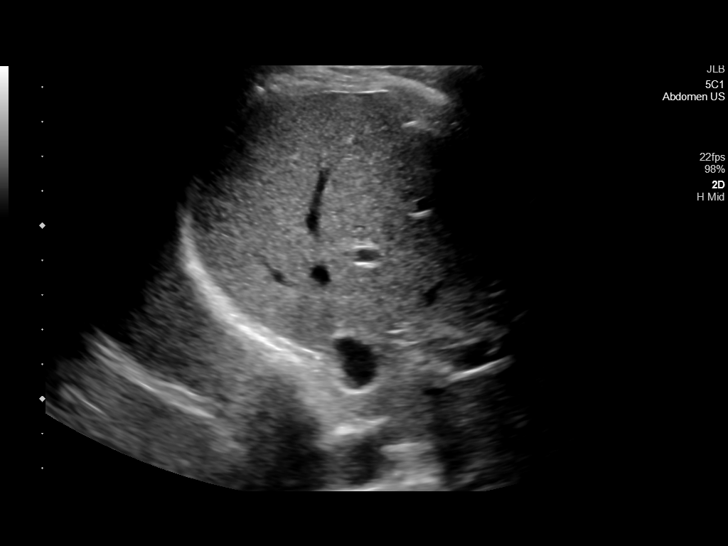
[im 62/62]
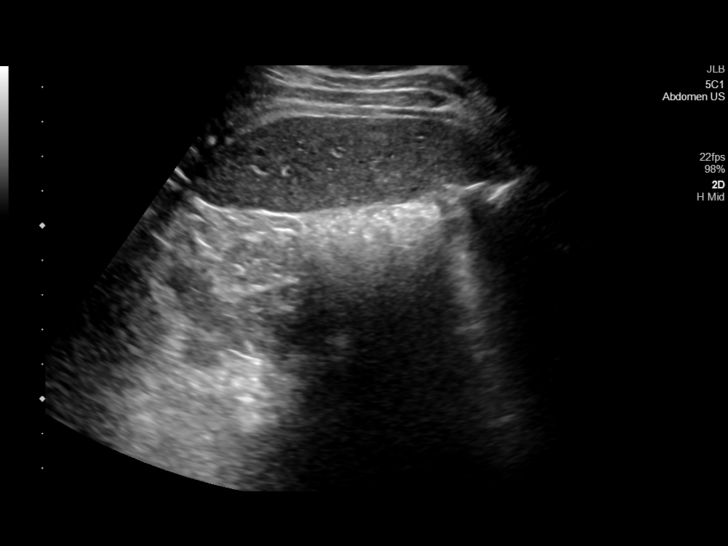

[14 of 25 positions shown; findings below may reference images not displayed]

FINDINGS: Gallbladder:

Multiple shadowing stones and tumefactive sludge. No wall thickening
or pericholecystic fluid. Negative sonographic Murphy sign. Largest
stone measures 12 mm.

Common bile duct:

Diameter: 2.9 mm.

Liver:

No focal lesion identified. Within normal limits in parenchymal
echogenicity. Portal vein is patent on color Doppler imaging with
normal direction of blood flow towards the liver.

Other: Incidental finding of a 2.6 cm cyst over the upper pole right
kidney.
IMPRESSION: 1. Moderate cholelithiasis and tumefactive sludge without evidence
of acute cholecystitis.

2.  2.6 cm upper pole right renal cyst.

## 2023-07-19 ENCOUNTER — Other Ambulatory Visit: Payer: Self-pay | Admitting: Student

## 2023-07-19 DIAGNOSIS — G35 Multiple sclerosis: Secondary | ICD-10-CM

## 2023-07-23 ENCOUNTER — Ambulatory Visit
Admission: RE | Admit: 2023-07-23 | Discharge: 2023-07-23 | Disposition: A | Discharge status: Home / Self Care | Payer: BC Managed Care – PPO | Source: Ambulatory Visit | Attending: Student | Admitting: Student

## 2023-07-23 DIAGNOSIS — G35 Multiple sclerosis: Secondary | ICD-10-CM

## 2023-07-23 MED ORDER — GADOBUTROL 1 MMOL/ML IV SOLN
4.0000 mL | Freq: Once | INTRAVENOUS | Status: AC | PRN
  Administered 2023-07-23: 4 mL via INTRAVENOUS

## 2023-09-07 ENCOUNTER — Encounter: Payer: Self-pay | Admitting: Emergency Medicine

## 2023-09-07 ENCOUNTER — Other Ambulatory Visit: Payer: Self-pay

## 2023-09-07 DIAGNOSIS — R070 Pain in throat: Secondary | ICD-10-CM | POA: Diagnosis not present

## 2023-09-07 DIAGNOSIS — Z5321 Procedure and treatment not carried out due to patient leaving prior to being seen by health care provider: Secondary | ICD-10-CM | POA: Insufficient documentation

## 2023-09-07 DIAGNOSIS — S20469A Insect bite (nonvenomous) of unspecified back wall of thorax, initial encounter: Secondary | ICD-10-CM | POA: Diagnosis present

## 2023-09-07 DIAGNOSIS — W57XXXA Bitten or stung by nonvenomous insect and other nonvenomous arthropods, initial encounter: Secondary | ICD-10-CM | POA: Diagnosis not present

## 2023-09-07 NOTE — ED Triage Notes (Signed)
Pt presents ambulatory to triage via POV with complaints of tick bite on her upper back, pt has a scabbed area to the mid upper back. Spouse tried to remove it but is unsure if he had removed it all. Pt also endorses 5 days of throat spasms - airway intact. A&Ox4 at this time. Denies fevers, chills, cough, sore throat.

## 2023-09-08 ENCOUNTER — Emergency Department
Admission: EM | Admit: 2023-09-08 | Discharge: 2023-09-08 | Discharge status: Against Medical Advice | Payer: BC Managed Care – PPO | Attending: Emergency Medicine | Admitting: Emergency Medicine

## 2024-02-13 ENCOUNTER — Other Ambulatory Visit: Payer: Self-pay | Admitting: Family Medicine

## 2024-02-13 DIAGNOSIS — R1084 Generalized abdominal pain: Secondary | ICD-10-CM

## 2024-02-14 ENCOUNTER — Ambulatory Visit
Admission: RE | Admit: 2024-02-14 | Discharge: 2024-02-14 | Disposition: A | Discharge status: Home / Self Care | Payer: Self-pay | Source: Ambulatory Visit | Attending: Family Medicine | Admitting: Family Medicine

## 2024-02-14 DIAGNOSIS — R1084 Generalized abdominal pain: Secondary | ICD-10-CM | POA: Insufficient documentation

## 2024-02-20 ENCOUNTER — Ambulatory Visit: Payer: Self-pay | Admitting: General Surgery

## 2024-02-20 NOTE — H&P (Signed)
 PATIENT PROFILE: Roberta Dean is a 43 y.o. female who presents to the Clinic for consultation at the request of Dr. Sullivan Lone for evaluation of cholelithiasis.  PCP:  Mechele Collin, NP  History of Present Illness Roberta Dean is a 43 year old female with cholelithiasis who presents with abdominal pain. She is accompanied by her husband. She was referred by Dr. Sullivan Lone for evaluation of abdominal pain and cholelithiasis, possible symptomatic cholelithiasis.  She has been experiencing abdominal pain for approximately two years, beginning in 2022. The pain was initially attributed to gallstones and sludge observed in her gallbladder. An abdominal ultrasound performed on February 14, 2024, confirmed the presence of cholelithiasis and sludge, with a common bile duct diameter of 2.8 mm and a negative Murphy's sign. Despite these findings, no cholecystitis was noted.  The pain is located in the epigastric region and radiates to the middle of her back, particularly towards the right side. It intensifies after eating and has been persistent since a severe episode about a month ago, characterized by extreme pain, difficulty breathing, and vomiting. She describes the pain as 'ten times worse' than previous episodes and notes that it has not completely subsided since then. No fever or jaundice was reported.  She has a history of multiple surgeries including cystectomy with creation of ileal conduit and expresses concern about further surgical interventions. She also has multiple sclerosis, which complicates her pain management. A previous endoscopy ruled out stomach ulcers as a cause of her symptoms. She also has a history of bladder tumor surgery, which is monitored annually and currently stable.  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 Anti-C identified. The C (RH2) antigen is a member of the Rh blood group system. Anti-C is a potential cause of transfusion reactions, therefore units will be screened for this antigen. Approximately 32% of donors will be compatible.  Anti-Jkb identified. The Jkb (JK2) antigen is a member of the Kidd blood group system. Anti-Jkb is a potential cause of delayed hemolytic transfusion reactions. Red cell units will be screened for this antigen; approximately 26% of donors will be compatible. Blood may be more difficult to obtain and advance notice of intent to transfusion is requested.      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    GENERAL REVIEW OF SYSTEMS:   General ROS: negative for - chills, fatigue, fever, weight gain or weight loss Allergy and Immunology ROS: negative for - hives  Hematological and Lymphatic ROS: negative for - bleeding problems or bruising, negative for palpable nodes Endocrine ROS: negative for -  heat or cold intolerance, hair changes Respiratory ROS: negative for - cough, shortness of breath or wheezing Cardiovascular ROS: no chest pain or palpitations GI ROS: Positive for nausea, vomiting, abdominal pain,  Musculoskeletal ROS: negative for - joint swelling or muscle pain Neurological ROS: negative for - confusion,  syncope Dermatological ROS: negative for pruritus and rash Psychiatric: negative for anxiety, depression, difficulty sleeping and memory loss  MEDICATIONS: Current Outpatient Medications  Medication Sig Dispense Refill   gabapentin (NEURONTIN) 300 MG capsule Take 1 capsule (300 mg total) by mouth at bedtime 180 capsule 3   LINZESS 72 mcg capsule TAKE 1 CAPSULE(72 MCG) BY MOUTH EVERY DAY 90 capsule 3   ofatumumab (KESIMPTA PEN) 20 mg/0.4 mL PnIj Inject 1 pen! subcutaneously monthly 0.4 mL 6   omeprazole (PRILOSEC) 20 MG DR capsule Take 1 capsule (20 mg total) by mouth once daily 90 capsule 0   cholecalciferol (VITAMIN D3) 2,000 unit capsule Take 3 capsules daily for 3 months, then reduce to 1 capsule daily thereafter for Vitamin D Deficiency. (Patient not taking: Reported on 07/13/2023) 360 capsule 11   cyanocobalamin (VITAMIN B12) 1000 MCG tablet Take 2 tablets daily for 2 weeks, then reduce to 1 tablet daily thereafter for Vitamin B12 Deficiency. (Patient not taking: Reported on 07/13/2023) 360 tablet 11   No current facility-administered medications for this visit.    ALLERGIES: Patient has no known allergies.  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   Multiple sclerosis (CMS/HHS-HCC)     PAST SURGICAL HISTORY: Past Surgical History:  Procedure Laterality Date   SALPINGECTOMY N/A 2008   CYSTOURETHROSCOPY W/FULGURATION &/OR RESECTION BLADDER TUMOR N/A 06/28/2016   Procedure: Cystoscopy, Biopsy of Bladder Lesion;  Surgeon: Claris Pong, MD;  Location: [facility] OR;  Service: Urology;  Laterality: N/A;   EGD  08/07/2021   Gastritis/No repeat/CTL     FAMILY HISTORY: Family History  Problem Relation Name Age of Onset   High blood pressure (Hypertension) Mother     Cancer Father     Colon cancer Neg Hx     Colon polyps Neg Hx       SOCIAL HISTORY: Social History   Socioeconomic  History   Marital status: Single  Tobacco Use   Smoking status: Never   Smokeless tobacco: Never  Vaping Use   Vaping status: Never Used  Substance and Sexual Activity   Alcohol use: Never   Drug use: Never   Sexual activity: Yes   Social Drivers of Corporate investment banker Strain: Low Risk  (02/20/2024)   Overall Financial Resource Strain (CARDIA)    Difficulty of Paying Living Expenses: Not very hard  Food Insecurity: No Food Insecurity (02/20/2024)   Hunger Vital Sign    Worried About Running Out of Food in the Last Year: Never true    Ran Out of Food in the Last Year: Never true  Transportation Needs: No Transportation Needs (02/20/2024)   PRAPARE - Administrator, Civil Service (Medical): No    Lack of Transportation (Non-Medical): No    PHYSICAL EXAM: Vitals:   02/20/24 0931  BP: 116/62  Pulse: 86   Body mass index is 22.67 kg/m. Weight: 54.4 kg (120 lb)   GENERAL: Alert, active, oriented x3  HEENT: Pupils equal reactive to light. Extraocular movements are intact. Sclera clear. Palpebral conjunctiva normal red color.Pharynx clear.  NECK: Supple with no palpable mass and  no adenopathy.  LUNGS: Sound clear with no rales rhonchi or wheezes.  HEART: Regular rhythm S1 and S2 without murmur.  ABDOMEN: Soft and depressible, nontender with no palpable mass, no hepatomegaly.  Ileal conduit present in the right lower periumbilical area.  EXTREMITIES: Well-developed well-nourished symmetrical with no dependent edema.  NEUROLOGICAL: Awake alert oriented, facial expression symmetrical, moving all extremities.  REVIEW OF DATA: I have reviewed the following data today: Office Visit on 02/08/2024  Component Date Value   Color 02/08/2024 Yellow    Clarity 02/08/2024 Cloudy (!)    Specific Gravity 02/08/2024 1.020    pH, Urine 02/08/2024 7.5    Protein, Urinalysis 02/08/2024 >=300 (!)    Glucose, Urinalysis 02/08/2024 Negative    Ketones, Urinalysis  02/08/2024 Negative    Blood, Urinalysis 02/08/2024 Large (!)    Nitrite, Urinalysis 02/08/2024 Positive (!)    Leukocyte Esterase, Urin* 02/08/2024 Small (!)    White Blood Cells, Urina* 02/08/2024 10-50 (!)    Red Blood Cells, Urinaly* 02/08/2024 10-50 (!)    Bacteria, Urinalysis 02/08/2024 Many (!)    Squamous Epithelial Cell* 02/08/2024 Rare    Urine Culture, Routine -* 02/08/2024 Final report    Result 1 - [facility] 02/08/2024 Comment    WBC (White Blood Cell Co* 02/08/2024 4.0 (L)    RBC (Red Blood Cell Coun* 02/08/2024 3.75 (L)    Hemoglobin 02/08/2024 11.3 (L)    Hematocrit 02/08/2024 34.4 (L)    MCV (Mean Corpuscular Vo* 02/08/2024 91.7    MCH (Mean Corpuscular He* 02/08/2024 30.1    MCHC (Mean Corpuscular H* 02/08/2024 32.8    Platelet Count 02/08/2024 308    RDW-CV (Red Cell Distrib* 02/08/2024 12.8    MPV (Mean Platelet Volum* 02/08/2024 9.8    Neutrophils 02/08/2024 2.40    Lymphocytes 02/08/2024 1.10    Mixed Count 02/08/2024 0.50    Neutrophil % 02/08/2024 59.7    Lymphocyte % 02/08/2024 28.4    Mixed % 02/08/2024 11.9    Glucose 02/08/2024 89    Sodium 02/08/2024 139    Potassium 02/08/2024 4.1    Chloride 02/08/2024 109    Carbon Dioxide (CO2) 02/08/2024 26.0    Urea Nitrogen (BUN) 02/08/2024 8    Creatinine 02/08/2024 0.9    Glomerular Filtration Ra* 02/08/2024 82    Calcium 02/08/2024 9.4    AST  02/08/2024 14    ALT  02/08/2024 5    Alk Phos (alkaline Phosp* 02/08/2024 48    Albumin 02/08/2024 4.3    Bilirubin, Total 02/08/2024 0.4    Protein, Total 02/08/2024 7.6    A/G Ratio 02/08/2024 1.3      @RESULSEC @  Assessment & Plan Symptomatic Cholelithiasis   Presents with a two-year history of epigastric pain radiating to the right side of the back, exacerbated by eating, and associated with nausea and vomiting. An abdominal ultrasound on February 14, 2024, showed cholelithiasis and sludge without cholecystitis. The common bile duct diameter is 2.8 mm,  and Murphy's sign is negative. Symptoms and imaging findings are consistent with symptomatic cholelithiasis. Due to the severity and persistence of symptoms, surgical intervention is recommended. Discussed the risks and benefits of surgery, including the potential need for a larger incision due to previous abdominal surgeries and scar tissue. Recovery time and dietary recommendations post-surgery were discussed. No alternative medical treatments are available. She and her husband agreed to proceed with surgery.  I offered the patient to proceed with cholecystectomy this Wednesday but patient prefers to wait until next  week due to personal reasons.  Advise avoiding fatty, greasy, and fried foods. Recommend two weeks of rest post-surgery with a gradual return to normal activities. Discuss the potential need for a larger incision if scar tissue is present. Provide anticipatory guidance on post-surgery dietary changes and potential for increased bowel movements.  General Health Maintenance   Advised to maintain a healthy diet and lifestyle, particularly in light of upcoming surgery and MS. Emphasize avoiding fatty, greasy, and fried foods to manage symptoms and promote overall health. Advise maintaining a healthy diet and encourage regular physical activity as tolerated. Continue regular follow-up with the primary care physician for routine health maintenance.   Patient was oriented about the diagnosis of cholelithiasis. Also oriented about what is the gallbladder, its anatomy and function and the implications of having stones. Patient was oriented that a low percentage of patient will continue to have similar pain symptoms even after the gallbladder is removed. Surgical technique (open vs laparoscopic) was discussed. It was also discussed the goals of the surgery (decrease the pain episodes and avoid the risk of cholecystitis) and the risk of surgery including: bleeding, infection, common bile duct injury, stone  retention, injury to other organs such as bowel, liver, stomach, other complications such as hernia, bowel obstruction among others. Also discussed with patient about anesthesia and its complications such as: reaction to medications, pneumonia, heart complications, death, among others.   Cholelithiasis without cholecystitis [K80.20]  Patient and her husband verbalized understanding, all questions were answered, and were agreeable with the plan outlined above.     Carolan Shiver, MD  Electronically signed by Carolan Shiver, MD

## 2024-02-20 NOTE — H&P (View-Only) (Signed)
 PATIENT PROFILE: Roberta Dean is a 43 y.o. female who presents to the Clinic for consultation at the request of Dr. Sullivan Lone for evaluation of cholelithiasis.  PCP:  Mechele Collin, NP  History of Present Illness Roberta Dean is a 43 year old female with cholelithiasis who presents with abdominal pain. She is accompanied by her husband. She was referred by Dr. Sullivan Lone for evaluation of abdominal pain and cholelithiasis, possible symptomatic cholelithiasis.  She has been experiencing abdominal pain for approximately two years, beginning in 2022. The pain was initially attributed to gallstones and sludge observed in her gallbladder. An abdominal ultrasound performed on February 14, 2024, confirmed the presence of cholelithiasis and sludge, with a common bile duct diameter of 2.8 mm and a negative Murphy's sign. Despite these findings, no cholecystitis was noted.  The pain is located in the epigastric region and radiates to the middle of her back, particularly towards the right side. It intensifies after eating and has been persistent since a severe episode about a month ago, characterized by extreme pain, difficulty breathing, and vomiting. She describes the pain as 'ten times worse' than previous episodes and notes that it has not completely subsided since then. No fever or jaundice was reported.  She has a history of multiple surgeries including cystectomy with creation of ileal conduit and expresses concern about further surgical interventions. She also has multiple sclerosis, which complicates her pain management. A previous endoscopy ruled out stomach ulcers as a cause of her symptoms. She also has a history of bladder tumor surgery, which is monitored annually and currently stable.  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 Anti-C identified. The C (RH2) antigen is a member of the Rh blood group system. Anti-C is a potential cause of transfusion reactions, therefore units will be screened for this antigen. Approximately 32% of donors will be compatible.  Anti-Jkb identified. The Jkb (JK2) antigen is a member of the Kidd blood group system. Anti-Jkb is a potential cause of delayed hemolytic transfusion reactions. Red cell units will be screened for this antigen; approximately 26% of donors will be compatible. Blood may be more difficult to obtain and advance notice of intent to transfusion is requested.      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    GENERAL REVIEW OF SYSTEMS:   General ROS: negative for - chills, fatigue, fever, weight gain or weight loss Allergy and Immunology ROS: negative for - hives  Hematological and Lymphatic ROS: negative for - bleeding problems or bruising, negative for palpable nodes Endocrine ROS: negative for -  heat or cold intolerance, hair changes Respiratory ROS: negative for - cough, shortness of breath or wheezing Cardiovascular ROS: no chest pain or palpitations GI ROS: Positive for nausea, vomiting, abdominal pain,  Musculoskeletal ROS: negative for - joint swelling or muscle pain Neurological ROS: negative for - confusion,  syncope Dermatological ROS: negative for pruritus and rash Psychiatric: negative for anxiety, depression, difficulty sleeping and memory loss  MEDICATIONS: Current Outpatient Medications  Medication Sig Dispense Refill   gabapentin (NEURONTIN) 300 MG capsule Take 1 capsule (300 mg total) by mouth at bedtime 180 capsule 3   LINZESS 72 mcg capsule TAKE 1 CAPSULE(72 MCG) BY MOUTH EVERY DAY 90 capsule 3   ofatumumab (KESIMPTA PEN) 20 mg/0.4 mL PnIj Inject 1 pen! subcutaneously monthly 0.4 mL 6   omeprazole (PRILOSEC) 20 MG DR capsule Take 1 capsule (20 mg total) by mouth once daily 90 capsule 0   cholecalciferol (VITAMIN D3) 2,000 unit capsule Take 3 capsules daily for 3 months, then reduce to 1 capsule daily thereafter for Vitamin D Deficiency. (Patient not taking: Reported on 07/13/2023) 360 capsule 11   cyanocobalamin (VITAMIN B12) 1000 MCG tablet Take 2 tablets daily for 2 weeks, then reduce to 1 tablet daily thereafter for Vitamin B12 Deficiency. (Patient not taking: Reported on 07/13/2023) 360 tablet 11   No current facility-administered medications for this visit.    ALLERGIES: Patient has no known allergies.  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   Multiple sclerosis (CMS/HHS-HCC)     PAST SURGICAL HISTORY: Past Surgical History:  Procedure Laterality Date   SALPINGECTOMY N/A 2008   CYSTOURETHROSCOPY W/FULGURATION &/OR RESECTION BLADDER TUMOR N/A 06/28/2016   Procedure: Cystoscopy, Biopsy of Bladder Lesion;  Surgeon: Claris Pong, MD;  Location: [facility] OR;  Service: Urology;  Laterality: N/A;   EGD  08/07/2021   Gastritis/No repeat/CTL     FAMILY HISTORY: Family History  Problem Relation Name Age of Onset   High blood pressure (Hypertension) Mother     Cancer Father     Colon cancer Neg Hx     Colon polyps Neg Hx       SOCIAL HISTORY: Social History   Socioeconomic  History   Marital status: Single  Tobacco Use   Smoking status: Never   Smokeless tobacco: Never  Vaping Use   Vaping status: Never Used  Substance and Sexual Activity   Alcohol use: Never   Drug use: Never   Sexual activity: Yes   Social Drivers of Corporate investment banker Strain: Low Risk  (02/20/2024)   Overall Financial Resource Strain (CARDIA)    Difficulty of Paying Living Expenses: Not very hard  Food Insecurity: No Food Insecurity (02/20/2024)   Hunger Vital Sign    Worried About Running Out of Food in the Last Year: Never true    Ran Out of Food in the Last Year: Never true  Transportation Needs: No Transportation Needs (02/20/2024)   PRAPARE - Administrator, Civil Service (Medical): No    Lack of Transportation (Non-Medical): No    PHYSICAL EXAM: Vitals:   02/20/24 0931  BP: 116/62  Pulse: 86   Body mass index is 22.67 kg/m. Weight: 54.4 kg (120 lb)   GENERAL: Alert, active, oriented x3  HEENT: Pupils equal reactive to light. Extraocular movements are intact. Sclera clear. Palpebral conjunctiva normal red color.Pharynx clear.  NECK: Supple with no palpable mass and  no adenopathy.  LUNGS: Sound clear with no rales rhonchi or wheezes.  HEART: Regular rhythm S1 and S2 without murmur.  ABDOMEN: Soft and depressible, nontender with no palpable mass, no hepatomegaly.  Ileal conduit present in the right lower periumbilical area.  EXTREMITIES: Well-developed well-nourished symmetrical with no dependent edema.  NEUROLOGICAL: Awake alert oriented, facial expression symmetrical, moving all extremities.  REVIEW OF DATA: I have reviewed the following data today: Office Visit on 02/08/2024  Component Date Value   Color 02/08/2024 Yellow    Clarity 02/08/2024 Cloudy (!)    Specific Gravity 02/08/2024 1.020    pH, Urine 02/08/2024 7.5    Protein, Urinalysis 02/08/2024 >=300 (!)    Glucose, Urinalysis 02/08/2024 Negative    Ketones, Urinalysis  02/08/2024 Negative    Blood, Urinalysis 02/08/2024 Large (!)    Nitrite, Urinalysis 02/08/2024 Positive (!)    Leukocyte Esterase, Urin* 02/08/2024 Small (!)    White Blood Cells, Urina* 02/08/2024 10-50 (!)    Red Blood Cells, Urinaly* 02/08/2024 10-50 (!)    Bacteria, Urinalysis 02/08/2024 Many (!)    Squamous Epithelial Cell* 02/08/2024 Rare    Urine Culture, Routine -* 02/08/2024 Final report    Result 1 - [facility] 02/08/2024 Comment    WBC (White Blood Cell Co* 02/08/2024 4.0 (L)    RBC (Red Blood Cell Coun* 02/08/2024 3.75 (L)    Hemoglobin 02/08/2024 11.3 (L)    Hematocrit 02/08/2024 34.4 (L)    MCV (Mean Corpuscular Vo* 02/08/2024 91.7    MCH (Mean Corpuscular He* 02/08/2024 30.1    MCHC (Mean Corpuscular H* 02/08/2024 32.8    Platelet Count 02/08/2024 308    RDW-CV (Red Cell Distrib* 02/08/2024 12.8    MPV (Mean Platelet Volum* 02/08/2024 9.8    Neutrophils 02/08/2024 2.40    Lymphocytes 02/08/2024 1.10    Mixed Count 02/08/2024 0.50    Neutrophil % 02/08/2024 59.7    Lymphocyte % 02/08/2024 28.4    Mixed % 02/08/2024 11.9    Glucose 02/08/2024 89    Sodium 02/08/2024 139    Potassium 02/08/2024 4.1    Chloride 02/08/2024 109    Carbon Dioxide (CO2) 02/08/2024 26.0    Urea Nitrogen (BUN) 02/08/2024 8    Creatinine 02/08/2024 0.9    Glomerular Filtration Ra* 02/08/2024 82    Calcium 02/08/2024 9.4    AST  02/08/2024 14    ALT  02/08/2024 5    Alk Phos (alkaline Phosp* 02/08/2024 48    Albumin 02/08/2024 4.3    Bilirubin, Total 02/08/2024 0.4    Protein, Total 02/08/2024 7.6    A/G Ratio 02/08/2024 1.3      @RESULSEC @  Assessment & Plan Symptomatic Cholelithiasis   Presents with a two-year history of epigastric pain radiating to the right side of the back, exacerbated by eating, and associated with nausea and vomiting. An abdominal ultrasound on February 14, 2024, showed cholelithiasis and sludge without cholecystitis. The common bile duct diameter is 2.8 mm,  and Murphy's sign is negative. Symptoms and imaging findings are consistent with symptomatic cholelithiasis. Due to the severity and persistence of symptoms, surgical intervention is recommended. Discussed the risks and benefits of surgery, including the potential need for a larger incision due to previous abdominal surgeries and scar tissue. Recovery time and dietary recommendations post-surgery were discussed. No alternative medical treatments are available. She and her husband agreed to proceed with surgery.  I offered the patient to proceed with cholecystectomy this Wednesday but patient prefers to wait until next  week due to personal reasons.  Advise avoiding fatty, greasy, and fried foods. Recommend two weeks of rest post-surgery with a gradual return to normal activities. Discuss the potential need for a larger incision if scar tissue is present. Provide anticipatory guidance on post-surgery dietary changes and potential for increased bowel movements.  General Health Maintenance   Advised to maintain a healthy diet and lifestyle, particularly in light of upcoming surgery and MS. Emphasize avoiding fatty, greasy, and fried foods to manage symptoms and promote overall health. Advise maintaining a healthy diet and encourage regular physical activity as tolerated. Continue regular follow-up with the primary care physician for routine health maintenance.   Patient was oriented about the diagnosis of cholelithiasis. Also oriented about what is the gallbladder, its anatomy and function and the implications of having stones. Patient was oriented that a low percentage of patient will continue to have similar pain symptoms even after the gallbladder is removed. Surgical technique (open vs laparoscopic) was discussed. It was also discussed the goals of the surgery (decrease the pain episodes and avoid the risk of cholecystitis) and the risk of surgery including: bleeding, infection, common bile duct injury, stone  retention, injury to other organs such as bowel, liver, stomach, other complications such as hernia, bowel obstruction among others. Also discussed with patient about anesthesia and its complications such as: reaction to medications, pneumonia, heart complications, death, among others.   Cholelithiasis without cholecystitis [K80.20]  Patient and her husband verbalized understanding, all questions were answered, and were agreeable with the plan outlined above.     Carolan Shiver, MD  Electronically signed by Carolan Shiver, MD

## 2024-02-23 ENCOUNTER — Encounter
Admission: RE | Admit: 2024-02-23 | Discharge: 2024-02-23 | Disposition: A | Discharge status: Home / Self Care | Source: Ambulatory Visit | Attending: General Surgery | Admitting: General Surgery

## 2024-02-23 ENCOUNTER — Other Ambulatory Visit: Payer: Self-pay

## 2024-02-23 VITALS — Ht 61.0 in | Wt 120.0 lb

## 2024-02-23 DIAGNOSIS — Z01818 Encounter for other preprocedural examination: Secondary | ICD-10-CM

## 2024-02-23 HISTORY — DX: Nausea with vomiting, unspecified: Z98.890

## 2024-02-23 HISTORY — DX: Other specified abnormal immunological findings in serum: R76.8

## 2024-02-23 NOTE — Patient Instructions (Signed)
 Your procedure is scheduled on: Wednesday 03/01/24 To find out your arrival time, please call [phone] between 1PM - 3PM on:   Tuesday 02/29/24 Report to the Registration Desk on the 1st floor of the [facility]. Free Valet parking is available.  If your arrival time is 6:00 am, do not arrive before that time as the [facility] entrance doors do not open until 6:00 am.  REMEMBER: Instructions that are not followed completely may result in serious medical risk, up to and including death; or upon the discretion of your surgeon and anesthesiologist your surgery may need to be rescheduled.  Do not eat food or drink any liquids after midnight the night before surgery.  No gum chewing or hard candies.  One week prior to surgery: Stop Anti-inflammatories (NSAIDS) such as Advil, Aleve, Ibuprofen, Motrin, Naproxen, Naprosyn and Aspirin based products such as Excedrin, Goody's Powder, BC Powder. These medicines can increase your bleeding risk. You may however, continue to take Tylenol if needed for pain up until the day of surgery.  Stop ANY OVER THE COUNTER supplements and vitamins until after surgery.  Continue taking all prescribed medications.   TAKE ONLY THESE MEDICATIONS THE MORNING OF SURGERY WITH A SIP OF WATER:  omeprazole (PRILOSEC) 20 MG capsule Antacid (take one the night before and one on the morning of surgery - helps to prevent nausea after surgery.)  No Alcohol for 24 hours before or after surgery.  No Smoking including e-cigarettes for 24 hours before surgery.  No chewable tobacco products for at least 6 hours before surgery.  No nicotine patches on the day of surgery.  Do not use any "recreational" drugs for at least a week (preferably 2 weeks) before your surgery.  Please be advised that the combination of cocaine and anesthesia may have negative outcomes, up to and including death. If you test positive for cocaine, your surgery will be cancelled.  On the morning of  surgery brush your teeth with toothpaste and water, you may rinse your mouth with mouthwash if you wish. Do not swallow any toothpaste or mouthwash.  Use CHG Soap or wipes as directed on instruction sheet. You can pick this up at our office at [address] [facility], in the [facility] [address]. We are the first office on the right as you come into the building. Office hours 8 am - 4 pm.  Do not wear lotions, powders, or perfumes.   Do not shave body hair from the neck down 48 hours before surgery.  Wear comfortable clothing (specific to your surgery type) to the hospital.  Do not wear jewelry, make-up, hairpins, clips or nail polish.  For welded (permanent) jewelry: bracelets, anklets, waist bands, etc.  Please have this removed prior to surgery.  If it is not removed, there is a chance that hospital personnel will need to cut it off on the day of surgery. Contact lenses, hearing aids and dentures may not be worn into surgery.  Do not bring valuables to the hospital. [facility] is not responsible for any missing/lost belongings or valuables.   Notify your doctor if there is any change in your medical condition (cold, fever, infection).  If you are being discharged the day of surgery, you will not be allowed to drive home. You will need a responsible individual to drive you home and stay with you for 24 hours after surgery.   If you are taking public transportation, you will need to have a responsible  individual with you.  If you are being admitted to the hospital overnight, leave your suitcase in the car. After surgery it may be brought to your room.  In case of increased patient census, it may be necessary for you, the patient, to continue your postoperative care in the Same Day Surgery department.  After surgery, you can help prevent lung complications by doing breathing exercises.  Take deep breaths and cough every 1-2 hours. Your doctor may order a device called  an Incentive Spirometer to help you take deep breaths. When coughing or sneezing, hold a pillow firmly against your incision with both hands. This is called "splinting." Doing this helps protect your incision. It also decreases belly discomfort.  Surgery Visitation Policy:  Patients undergoing a surgery or procedure may have two family members or support persons with them as long as the person is not COVID-19 positive or experiencing its symptoms.   Inpatient Visitation:    Visiting hours are 7 a.m. to 8 p.m. Up to four visitors are allowed at one time in a patient room. The visitors may rotate out with other people during the day. One designated support person (adult) may remain overnight.  Due to an increase in RSV and influenza rates and associated hospitalizations, children ages 60 and under will not be able to visit patients in [facility]. Masks continue to be strongly recommended.  Please call the Pre-admissions Testing Dept. at [phone] if you have any questions about these instructions.     Preparing for Surgery with CHLORHEXIDINE GLUCONATE (CHG) Soap  Chlorhexidine Gluconate (CHG) Soap  o An antiseptic cleaner that kills germs and bonds with the skin to continue killing germs even after washing  o Used for showering the night before surgery and morning of surgery  Before surgery, you can play an important role by reducing the number of germs on your skin.  CHG (Chlorhexidine gluconate) soap is an antiseptic cleanser which kills germs and bonds with the skin to continue killing germs even after washing.  Please do not use if you have an allergy to CHG or antibacterial soaps. If your skin becomes reddened/irritated stop using the CHG.  1. Shower the NIGHT BEFORE SURGERY and the MORNING OF SURGERY with CHG soap.  2. If you choose to wash your hair, wash your hair first as usual with your normal shampoo.  3. After shampooing, rinse your hair and body  thoroughly to remove the shampoo.  4. Use CHG as you would any other liquid soap. You can apply CHG directly to the skin and wash gently with a scrungie or a clean washcloth.  5. Apply the CHG soap to your body only from the neck down. Do not use on open wounds or open sores. Avoid contact with your eyes, ears, mouth, and genitals (private parts). Wash face and genitals (private parts) with your normal soap.  6. Wash thoroughly, paying special attention to the area where your surgery will be performed.  7. Thoroughly rinse your body with warm water.  8. Do not shower/wash with your normal soap after using and rinsing off the CHG soap.  9. Pat yourself dry with a clean towel.  10. Wear clean pajamas to bed the night before surgery.  12. Place clean sheets on your bed the night of your first shower and do not sleep with pets.  13. Shower again with the CHG soap on the day of surgery prior to arriving at the hospital.  14. Do not apply  any deodorants/lotions/powders.  15. Please wear clean clothes to the hospital.

## 2024-02-28 MED ORDER — INDOCYANINE GREEN 25 MG IV SOLR
1.2500 mg | Freq: Once | INTRAVENOUS | Status: AC
  Administered 2024-02-29: 1.25 mg via INTRAVENOUS

## 2024-02-28 MED ORDER — LACTATED RINGERS IV SOLN: INTRAVENOUS | Status: DC

## 2024-02-28 MED ORDER — CHLORHEXIDINE GLUCONATE 0.12 % MT SOLN
15.0000 mL | Freq: Once | OROMUCOSAL | Status: AC
  Administered 2024-02-29: 15 mL via OROMUCOSAL

## 2024-02-28 MED ORDER — ORAL CARE MOUTH RINSE: 15.0000 mL | Freq: Once | OROMUCOSAL | Status: AC

## 2024-02-28 MED ORDER — CEFAZOLIN SODIUM-DEXTROSE 2-4 GM/100ML-% IV SOLN
2.0000 g | INTRAVENOUS | Status: AC
  Administered 2024-02-29: 2 g via INTRAVENOUS

## 2024-02-29 ENCOUNTER — Other Ambulatory Visit: Payer: Self-pay

## 2024-02-29 ENCOUNTER — Ambulatory Visit: Admitting: Anesthesiology

## 2024-02-29 ENCOUNTER — Encounter
Admission: RE | Disposition: A | Discharge status: Home / Self Care | Payer: Self-pay | Source: Home / Self Care | Attending: General Surgery

## 2024-02-29 ENCOUNTER — Ambulatory Visit
Admission: RE | Admit: 2024-02-29 | Discharge: 2024-02-29 | Disposition: A | Discharge status: Home / Self Care | Attending: General Surgery | Admitting: General Surgery

## 2024-02-29 ENCOUNTER — Encounter: Payer: Self-pay | Admitting: General Surgery

## 2024-02-29 DIAGNOSIS — K8010 Calculus of gallbladder with chronic cholecystitis without obstruction: Secondary | ICD-10-CM | POA: Insufficient documentation

## 2024-02-29 DIAGNOSIS — Z01818 Encounter for other preprocedural examination: Secondary | ICD-10-CM

## 2024-02-29 DIAGNOSIS — G35 Multiple sclerosis: Secondary | ICD-10-CM | POA: Diagnosis not present

## 2024-02-29 LAB — POCT PREGNANCY, URINE: Preg Test, Ur: NEGATIVE

## 2024-02-29 SURGERY — CHOLECYSTECTOMY, ROBOT-ASSISTED, LAPAROSCOPIC
Anesthesia: General | Site: Abdomen

## 2024-02-29 MED ORDER — 0.9 % SODIUM CHLORIDE (POUR BTL) OPTIME
TOPICAL | Status: DC | PRN
  Administered 2024-02-29: 30 mL

## 2024-02-29 MED ORDER — ONDANSETRON HCL 4 MG/2ML IJ SOLN
4.0000 mg | Freq: Once | INTRAMUSCULAR | Status: AC | PRN
  Administered 2024-02-29: 4 mg via INTRAVENOUS

## 2024-02-29 MED ORDER — INDOCYANINE GREEN 25 MG IV SOLR
INTRAVENOUS | Status: AC
  Filled 2024-02-29: qty 10

## 2024-02-29 MED ORDER — FENTANYL CITRATE (PF) 100 MCG/2ML IJ SOLN
INTRAMUSCULAR | Status: DC | PRN
  Administered 2024-02-29: 100 ug via INTRAVENOUS

## 2024-02-29 MED ORDER — FENTANYL CITRATE (PF) 100 MCG/2ML IJ SOLN
INTRAMUSCULAR | Status: AC
  Filled 2024-02-29: qty 2

## 2024-02-29 MED ORDER — DEXMEDETOMIDINE HCL IN NACL 80 MCG/20ML IV SOLN
INTRAVENOUS | Status: DC | PRN
Start: 2024-02-29 — End: 2024-02-29
  Administered 2024-02-29 (×2): 4 ug via INTRAVENOUS

## 2024-02-29 MED ORDER — PROPOFOL 10 MG/ML IV BOLUS
INTRAVENOUS | Status: DC | PRN
  Administered 2024-02-29: 150 ug/kg/min via INTRAVENOUS
  Administered 2024-02-29: 120 mg via INTRAVENOUS

## 2024-02-29 MED ORDER — OXYCODONE HCL 5 MG PO TABS
5.0000 mg | ORAL_TABLET | Freq: Once | ORAL | Status: AC | PRN
  Administered 2024-02-29: 5 mg via ORAL

## 2024-02-29 MED ORDER — CHLORHEXIDINE GLUCONATE 0.12 % MT SOLN
OROMUCOSAL | Status: AC
  Filled 2024-02-29: qty 15

## 2024-02-29 MED ORDER — VISTASEAL 4 ML SINGLE DOSE KIT
PACK | CUTANEOUS | Status: AC
  Filled 2024-02-29: qty 4

## 2024-02-29 MED ORDER — KETAMINE HCL 10 MG/ML IJ SOLN
INTRAMUSCULAR | Status: DC | PRN
Start: 2024-02-29 — End: 2024-02-29
  Administered 2024-02-29: 40 mg via INTRAVENOUS

## 2024-02-29 MED ORDER — PROPOFOL 1000 MG/100ML IV EMUL
INTRAVENOUS | Status: AC
  Filled 2024-02-29: qty 100

## 2024-02-29 MED ORDER — ONDANSETRON HCL 4 MG/2ML IJ SOLN
INTRAMUSCULAR | Status: DC | PRN
  Administered 2024-02-29: 4 mg via INTRAVENOUS

## 2024-02-29 MED ORDER — VISTASEAL 4 ML SINGLE DOSE KIT
PACK | CUTANEOUS | Status: DC | PRN
  Administered 2024-02-29: 4 mL via TOPICAL

## 2024-02-29 MED ORDER — BUPIVACAINE-EPINEPHRINE (PF) 0.5% -1:200000 IJ SOLN
INTRAMUSCULAR | Status: AC
  Filled 2024-02-29: qty 30

## 2024-02-29 MED ORDER — ACETAMINOPHEN 10 MG/ML IV SOLN: 1000.0000 mg | Freq: Once | INTRAVENOUS | Status: DC | PRN

## 2024-02-29 MED ORDER — ROCURONIUM BROMIDE 10 MG/ML (PF) SYRINGE
PREFILLED_SYRINGE | INTRAVENOUS | Status: AC
  Filled 2024-02-29: qty 10

## 2024-02-29 MED ORDER — ACETAMINOPHEN 10 MG/ML IV SOLN
INTRAVENOUS | Status: AC
  Filled 2024-02-29: qty 100

## 2024-02-29 MED ORDER — BUPIVACAINE-EPINEPHRINE (PF) 0.25% -1:200000 IJ SOLN
INTRAMUSCULAR | Status: AC
  Filled 2024-02-29: qty 30

## 2024-02-29 MED ORDER — ACETAMINOPHEN 10 MG/ML IV SOLN
INTRAVENOUS | Status: DC | PRN
  Administered 2024-02-29: 1000 mg via INTRAVENOUS

## 2024-02-29 MED ORDER — ONDANSETRON HCL 4 MG/2ML IJ SOLN
INTRAMUSCULAR | Status: AC
  Filled 2024-02-29: qty 2

## 2024-02-29 MED ORDER — OXYCODONE HCL 5 MG/5ML PO SOLN: 5.0000 mg | Freq: Once | ORAL | Status: AC | PRN

## 2024-02-29 MED ORDER — LIDOCAINE HCL (PF) 2 % IJ SOLN
INTRAMUSCULAR | Status: AC
  Filled 2024-02-29: qty 5

## 2024-02-29 MED ORDER — EPHEDRINE 5 MG/ML INJ
INTRAVENOUS | Status: AC
  Filled 2024-02-29: qty 5

## 2024-02-29 MED ORDER — CEFAZOLIN SODIUM-DEXTROSE 2-4 GM/100ML-% IV SOLN
INTRAVENOUS | Status: AC
  Filled 2024-02-29: qty 100

## 2024-02-29 MED ORDER — KETAMINE HCL 50 MG/5ML IJ SOSY
PREFILLED_SYRINGE | INTRAMUSCULAR | Status: AC
  Filled 2024-02-29: qty 5

## 2024-02-29 MED ORDER — BUPIVACAINE-EPINEPHRINE 0.25% -1:200000 IJ SOLN
INTRAMUSCULAR | Status: DC | PRN
  Administered 2024-02-29: 30 mL

## 2024-02-29 MED ORDER — ONDANSETRON HCL 4 MG/2ML IJ SOLN
INTRAMUSCULAR | Status: AC
Start: 2024-02-29 — End: ?
  Filled 2024-02-29: qty 2

## 2024-02-29 MED ORDER — OXYCODONE HCL 5 MG PO TABS
ORAL_TABLET | ORAL | Status: AC
  Filled 2024-02-29: qty 1

## 2024-02-29 MED ORDER — DROPERIDOL 2.5 MG/ML IJ SOLN
0.6250 mg | Freq: Once | INTRAMUSCULAR | Status: AC
  Administered 2024-02-29: 0.625 mg via INTRAVENOUS

## 2024-02-29 MED ORDER — DEXAMETHASONE SODIUM PHOSPHATE 4 MG/ML IJ SOLN
INTRAMUSCULAR | Status: DC | PRN
  Administered 2024-02-29: 4 mg via INTRAVENOUS

## 2024-02-29 MED ORDER — MIDAZOLAM HCL 2 MG/2ML IJ SOLN
INTRAMUSCULAR | Status: DC | PRN
  Administered 2024-02-29: 2 mg via INTRAVENOUS

## 2024-02-29 MED ORDER — LIDOCAINE HCL (CARDIAC) PF 100 MG/5ML IV SOSY
PREFILLED_SYRINGE | INTRAVENOUS | Status: DC | PRN
  Administered 2024-02-29: 60 mg via INTRAVENOUS

## 2024-02-29 MED ORDER — FENTANYL CITRATE (PF) 100 MCG/2ML IJ SOLN
INTRAMUSCULAR | Status: AC
Start: 2024-02-29 — End: ?
  Filled 2024-02-29: qty 2

## 2024-02-29 MED ORDER — SCOPOLAMINE 1 MG/3DAYS TD PT72
MEDICATED_PATCH | TRANSDERMAL | Status: AC
  Filled 2024-02-29: qty 1

## 2024-02-29 MED ORDER — PHENYLEPHRINE HCL-NACL 20-0.9 MG/250ML-% IV SOLN
INTRAVENOUS | Status: AC
  Filled 2024-02-29: qty 250

## 2024-02-29 MED ORDER — SUGAMMADEX SODIUM 500 MG/5ML IV SOLN
INTRAVENOUS | Status: DC | PRN
  Administered 2024-02-29: 150 mg via INTRAVENOUS

## 2024-02-29 MED ORDER — FENTANYL CITRATE (PF) 100 MCG/2ML IJ SOLN
25.0000 ug | INTRAMUSCULAR | Status: DC | PRN
  Administered 2024-02-29 (×2): 25 ug via INTRAVENOUS

## 2024-02-29 MED ORDER — MIDAZOLAM HCL 2 MG/2ML IJ SOLN
INTRAMUSCULAR | Status: AC
  Filled 2024-02-29: qty 2

## 2024-02-29 MED ORDER — DROPERIDOL 2.5 MG/ML IJ SOLN
INTRAMUSCULAR | Status: AC
  Filled 2024-02-29: qty 2

## 2024-02-29 MED ORDER — PROPOFOL 10 MG/ML IV BOLUS
INTRAVENOUS | Status: AC
  Filled 2024-02-29: qty 20

## 2024-02-29 MED ORDER — ROCURONIUM BROMIDE 100 MG/10ML IV SOLN
INTRAVENOUS | Status: DC | PRN
  Administered 2024-02-29: 10 mg via INTRAVENOUS
  Administered 2024-02-29: 50 mg via INTRAVENOUS

## 2024-02-29 MED ORDER — HYDROCODONE-ACETAMINOPHEN 5-325 MG PO TABS: 1.0000 | ORAL_TABLET | Freq: Four times a day (QID) | ORAL | 0 refills | Status: AC | PRN

## 2024-02-29 MED ORDER — PHENYLEPHRINE 80 MCG/ML (10ML) SYRINGE FOR IV PUSH (FOR BLOOD PRESSURE SUPPORT)
PREFILLED_SYRINGE | INTRAVENOUS | Status: AC
  Filled 2024-02-29: qty 10

## 2024-02-29 SURGICAL SUPPLY — 52 items
APPLICATOR VISTASEAL FLEXIBLE (MISCELLANEOUS) IMPLANT
BAG PRESSURE INF REUSE 1000 (BAG) IMPLANT
CANNULA REDUCER 12-8 DVNC XI (CANNULA) ×1 IMPLANT
CATH REDDICK CHOLANGI 4FR 50CM (CATHETERS) IMPLANT
CAUTERY HOOK MNPLR 1.6 DVNC XI (INSTRUMENTS) ×1 IMPLANT
CLIP LIGATING HEM O LOK PURPLE (MISCELLANEOUS) IMPLANT
CLIP LIGATING HEMO O LOK GREEN (MISCELLANEOUS) ×1 IMPLANT
DERMABOND ADVANCED .7 DNX12 (GAUZE/BANDAGES/DRESSINGS) ×1 IMPLANT
DRAPE ARM DVNC X/XI (DISPOSABLE) ×4 IMPLANT
DRAPE C-ARM XRAY 36X54 (DRAPES) IMPLANT
DRAPE COLUMN DVNC XI (DISPOSABLE) ×1 IMPLANT
DRAPE INCISE IOBAN 66X45 STRL (DRAPES) IMPLANT
ELECT REM PT RETURN 9FT ADLT (ELECTROSURGICAL) ×1 IMPLANT
ELECTRODE REM PT RTRN 9FT ADLT (ELECTROSURGICAL) ×1 IMPLANT
FORCEPS BPLR 8 MD DVNC XI (FORCEP) ×1 IMPLANT
FORCEPS BPLR FENES DVNC XI (FORCEP) ×1 IMPLANT
FORCEPS PROGRASP DVNC XI (FORCEP) ×1 IMPLANT
GLOVE BIO SURGEON STRL SZ 6.5 (GLOVE) ×2 IMPLANT
GLOVE BIOGEL PI IND STRL 6.5 (GLOVE) ×2 IMPLANT
GOWN STRL REUS W/ TWL LRG LVL3 (GOWN DISPOSABLE) ×3 IMPLANT
GRASPER SUT TROCAR 14GX15 (MISCELLANEOUS) ×1 IMPLANT
IRRIGATOR SUCT 8 DISP DVNC XI (IRRIGATION / IRRIGATOR) IMPLANT
IV CATH ANGIO 12GX3 LT BLUE (NEEDLE) IMPLANT
IV NS 1000ML BAXH (IV SOLUTION) IMPLANT
KIT PINK PAD W/HEAD ARE REST (MISCELLANEOUS) ×1 IMPLANT
KIT PINK PAD W/HEAD ARM REST (MISCELLANEOUS) ×1 IMPLANT
LABEL OR SOLS (LABEL) ×1 IMPLANT
MANIFOLD NEPTUNE II (INSTRUMENTS) ×1 IMPLANT
NDL HYPO 22X1.5 SAFETY MO (MISCELLANEOUS) ×1 IMPLANT
NDL INSUFFLATION 14GA 120MM (NEEDLE) ×1 IMPLANT
NEEDLE HYPO 22X1.5 SAFETY MO (MISCELLANEOUS) ×1 IMPLANT
NEEDLE INSUFFLATION 14GA 120MM (NEEDLE) ×1 IMPLANT
NS IRRIG 500ML POUR BTL (IV SOLUTION) ×1 IMPLANT
OBTURATOR OPTICAL STND 8 DVNC (TROCAR) ×1 IMPLANT
OBTURATOR OPTICALSTD 8 DVNC (TROCAR) ×1 IMPLANT
PACK LAP CHOLECYSTECTOMY (MISCELLANEOUS) ×1 IMPLANT
POUCH UROSTOMY 1 3/4 SMALL GR (OSTOMY) IMPLANT
SEAL UNIV 5-12 XI (MISCELLANEOUS) ×4 IMPLANT
SET TUBE SMOKE EVAC HIGH FLOW (TUBING) ×1 IMPLANT
SOL ELECTROSURG ANTI STICK (MISCELLANEOUS) ×1 IMPLANT
SOLUTION ELECTROSURG ANTI STCK (MISCELLANEOUS) ×1 IMPLANT
SPIKE FLUID TRANSFER (MISCELLANEOUS) ×2 IMPLANT
SPONGE T-LAP 4X18 ~~LOC~~+RFID (SPONGE) IMPLANT
SUT MNCRL 4-0 27 PS-2 XMFL (SUTURE) ×1 IMPLANT
SUT VICRYL 0 UR6 27IN ABS (SUTURE) ×1 IMPLANT
SUTURE MNCRL 4-0 27XMF (SUTURE) ×1 IMPLANT
SYS BAG RETRIEVAL 10MM (BASKET) ×1 IMPLANT
SYSTEM BAG RETRIEVAL 10MM (BASKET) ×1 IMPLANT
TRAP FLUID SMOKE EVACUATOR (MISCELLANEOUS) ×1 IMPLANT
TROCAR Z-THREAD FIOS 5X100MM (TROCAR) IMPLANT
WAFER FLANGE 1 3/4 SMALL GREEN (OSTOMY) IMPLANT
WATER STERILE IRR 500ML POUR (IV SOLUTION) ×1 IMPLANT

## 2024-02-29 NOTE — Transfer of Care (Signed)
 Immediate Anesthesia Transfer of Care Note  Patient: Roberta Dean  Procedure(s) Performed: CHOLECYSTECTOMY, ROBOT-ASSISTED, LAPAROSCOPIC (Abdomen)  Patient Location: PACU  Anesthesia Type:General  Level of Consciousness: sedated  Airway & Oxygen Therapy: Patient Spontanous Breathing and Patient connected to face mask oxygen  Post-op Assessment: Report given to RN and Post -op Vital signs reviewed and stable  Post vital signs: stable  Last Vitals:  Vitals Value Taken Time  BP 120/77 02/29/24 0930  Temp 36.2 C 02/29/24 0930  Pulse 99 02/29/24 0931  Resp 19 02/29/24 0931  SpO2 100 % 02/29/24 0931  Vitals shown include unfiled device data.  Last Pain:  Vitals:   02/29/24 0637  TempSrc: Temporal  PainSc: 6          Complications: No notable events documented.

## 2024-02-29 NOTE — Anesthesia Postprocedure Evaluation (Signed)
 Anesthesia Post Note  Patient: Roberta Dean  Procedure(s) Performed: CHOLECYSTECTOMY, ROBOT-ASSISTED, LAPAROSCOPIC (Abdomen)  Patient location during evaluation: PACU Anesthesia Type: General Level of consciousness: awake and alert Pain management: pain level controlled Vital Signs Assessment: post-procedure vital signs reviewed and stable Respiratory status: spontaneous breathing, nonlabored ventilation, respiratory function stable and patient connected to nasal cannula oxygen Cardiovascular status: blood pressure returned to baseline and stable Postop Assessment: no apparent nausea or vomiting Anesthetic complications: no   No notable events documented.   Last Vitals:  Vitals:   02/29/24 1045 02/29/24 1100  BP: 127/80 133/83  Pulse: 89 88  Resp: 13 15  Temp:    SpO2: 100% 100%    Last Pain:  Vitals:   02/29/24 1131  TempSrc:   PainSc: 7                  Corinda Gubler

## 2024-02-29 NOTE — Anesthesia Preprocedure Evaluation (Signed)
 Anesthesia Evaluation  Patient identified by MRN, date of birth, ID band Patient awake    Reviewed: Allergy & Precautions, NPO status , Patient's Chart, lab work & pertinent test results  History of Anesthesia Complications (+) PONV and history of anesthetic complications  Airway Mallampati: III  TM Distance: >3 FB Neck ROM: Full    Dental no notable dental hx. (+) Teeth Intact   Pulmonary neg pulmonary ROS, neg sleep apnea, neg COPD, Patient abstained from smoking.Not current smoker   Pulmonary exam normal breath sounds clear to auscultation       Cardiovascular Exercise Tolerance: Good METS(-) hypertension(-) CAD and (-) Past MI negative cardio ROS (-) dysrhythmias  Rhythm:Regular Rate:Normal - Systolic murmurs    Neuro/Psych Multiple sclerosis, on monthly monoclonal antibody injections. Main symptoms are numbness and tingling in extremities.  Neuromuscular disease  negative psych ROS   GI/Hepatic ,GERD  Medicated and Controlled,,(+)     (-) substance abuse    Endo/Other  neg diabetes    Renal/GU negative Renal ROS     Musculoskeletal   Abdominal   Peds  Hematology   Anesthesia Other Findings Past Medical History: No date: Attention to urostomy [facility]) No date: GERD (gastroesophageal reflux disease) No date: Interstitial cystitis No date: Multiple sclerosis (HCC) No date: PONV (postoperative nausea and vomiting) No date: Red blood cell antibody positive  Reproductive/Obstetrics                              Anesthesia Physical Anesthesia Plan  ASA: 2  Anesthesia Plan: General   Post-op Pain Management: Ofirmev IV (intra-op)*   Induction: Intravenous  PONV Risk Score and Plan: 4 or greater and Ondansetron, Dexamethasone, Midazolam, TIVA and Propofol infusion  Airway Management Planned: Oral ETT and Video Laryngoscope Planned  Additional Equipment: None  Intra-op Plan:    Post-operative Plan: Extubation in OR  Informed Consent: I have reviewed the patients History and Physical, chart, labs and discussed the procedure including the risks, benefits and alternatives for the proposed anesthesia with the patient or authorized representative who has indicated his/her understanding and acceptance.     Dental advisory given  Plan Discussed with: CRNA and Surgeon  Anesthesia Plan Comments: (Discussed risks of anesthesia with patient, including PONV, sore throat, lip/dental/eye damage. Rare risks discussed as well, such as cardiorespiratory and neurological sequelae, and allergic reactions. Discussed the role of CRNA in patient's perioperative care. Patient understands. Discussed potential increased risk of MS relapse in perioperative and post operative period given the stresses of surgery and anesthesia.)         Anesthesia Quick Evaluation

## 2024-02-29 NOTE — Op Note (Signed)
 Preoperative diagnosis: Symptomatic cholelithiasis  Postoperative diagnosis: Same  Procedure: Robotic Assisted Laparoscopic Cholecystectomy.   Anesthesia: GETA   Surgeon: Dr. Hazle Quant  Wound Classification: Clean Contaminated  Indications: Patient is a 43 y.o. female developed right upper quadrant pain and on workup was found to have cholelithiasis with a normal common duct. Robotic Assisted Laparoscopic cholecystectomy was elected.  Findings: No injury to ileal conduit identified during procedure.  Critical view of safety achieved Cystic duct and artery identified, ligated and divided Adequate hemostasis                   Description of procedure: The patient was placed on the operating table in the supine position. General anesthesia was induced. A time-out was completed verifying correct patient, procedure, site, positioning, and implant(s) and/or special equipment prior to beginning this procedure. An orogastric tube was placed. The abdomen was prepped and draped in the usual sterile fashion.  Continue to conduct was covered with a stoma and a Ioban drape. An incision was made in a natural skin line on the left upper quadrant The fascia was elevated and the Veress needle inserted. Proper position was confirmed by aspiration and saline meniscus test.  The abdomen was insufflated with carbon dioxide to a pressure of 15 mmHg. The patient tolerated insufflation well. A 5-mm trocar was then inserted in optiview fashion.  The laparoscope was inserted and the abdomen inspected. No injuries from initial trocar placement were noted.  The ileal conduit was identified in the right lower quadrant.  I was able to place my for lower abdominal trocars around the ileal conduit without any difficulty.  The abdomen was inspected and no abnormalities were found. The table was placed in the reverse Trendelenburg position with the right side up. The robotic arms were docked and target  anatomy identified. Instrument inserted under direct visualization.  Filmy adhesions between the gallbladder and omentum, duodenum and transverse colon were lysed with electrocautery. The dome of the gallbladder was grasped with a prograsp and retracted over the dome of the liver. The infundibulum was also grasped with an atraumatic grasper and retracted toward the right lower quadrant. This maneuver exposed Calot's triangle. The peritoneum overlying the gallbladder infundibulum was then incised and the cystic duct and cystic artery identified and circumferentially dissected. Critical view of safety reviewed before ligating any structure. Firefly images taken to visualize biliary ducts. The cystic duct and cystic artery were then doubly clipped and divided close to the gallbladder.  The gallbladder was then dissected from its peritoneal attachments by electrocautery. Hemostasis was checked and the gallbladder and contained stones were removed using an endoscopic retrieval bag. The gallbladder was passed off the table as a specimen. The gallbladder fossa was copiously irrigated with saline and hemostasis was obtained. There was no evidence of bleeding from the gallbladder fossa or cystic artery or leakage of the bile from the cystic duct stump. Secondary trocars were removed under direct vision. No bleeding was noted. The robotic arms were undoked. The scope was withdrawn and the umbilical trocar removed. The abdomen was allowed to collapse. The fascia of the 12mm trocar sites was closed with figure-of-eight 0 vicryl sutures. The skin was closed with subcuticular sutures of 4-0 monocryl and topical skin adhesive. The orogastric tube was removed.  The patient tolerated the procedure well and was taken to the postanesthesia care unit in stable condition.   Specimen: Gallbladder  Complications: None  EBL: 5 mL

## 2024-02-29 NOTE — Anesthesia Procedure Notes (Signed)
 Procedure Name: Intubation Date/Time: 02/29/2024 7:37 AM  Performed by: Darrell Jewel I, CRNAPre-anesthesia Checklist: Patient identified, Patient being monitored, Timeout performed, Emergency Drugs available and Suction available Patient Re-evaluated:Patient Re-evaluated prior to induction Oxygen Delivery Method: Circle system utilized Preoxygenation: Pre-oxygenation with 100% oxygen Induction Type: IV induction Ventilation: Mask ventilation without difficulty Laryngoscope Size: 3 and McGrath Grade View: Grade I Tube type: Oral Tube size: 6.0 mm Number of attempts: 1 Airway Equipment and Method: Stylet Placement Confirmation: ETT inserted through vocal cords under direct vision, positive ETCO2 and breath sounds checked- equal and bilateral Secured at: 21 cm Tube secured with: Tape Dental Injury: Teeth and Oropharynx as per pre-operative assessment

## 2024-02-29 NOTE — Interval H&P Note (Signed)
 History and Physical Interval Note:  02/29/2024 6:55 AM  Roberta Dean  has presented today for surgery, with the diagnosis of cholelithiasis w/o cholecystitis.  The various methods of treatment have been discussed with the patient and family. After consideration of risks, benefits and other options for treatment, the patient has consented to  Procedure(s): CHOLECYSTECTOMY, ROBOT-ASSISTED, LAPAROSCOPIC (N/A) as a surgical intervention.  The patient's history has been reviewed, patient examined, no change in status, stable for surgery.  I have reviewed the patient's chart and labs.  Questions were answered to the patient's satisfaction.     Carolan Shiver

## 2024-02-29 NOTE — Discharge Instructions (Addendum)

## 2024-03-01 LAB — SURGICAL PATHOLOGY

## 2024-05-27 ENCOUNTER — Ambulatory Visit
Admission: EM | Admit: 2024-05-27 | Discharge: 2024-05-27 | Disposition: A | Discharge status: Home / Self Care | Attending: Emergency Medicine | Admitting: Emergency Medicine

## 2024-05-27 DIAGNOSIS — L299 Pruritus, unspecified: Secondary | ICD-10-CM | POA: Diagnosis not present

## 2024-05-27 MED ORDER — HYDROXYZINE HCL 25 MG PO TABS: 25.0000 mg | ORAL_TABLET | Freq: Four times a day (QID) | ORAL | 0 refills | Status: AC

## 2024-05-27 MED ORDER — METHYLPREDNISOLONE ACETATE 80 MG/ML IJ SUSP
60.0000 mg | Freq: Once | INTRAMUSCULAR | Status: AC
  Administered 2024-05-27: 60 mg via INTRAMUSCULAR

## 2024-05-27 MED ORDER — PREDNISONE 10 MG (21) PO TBPK
ORAL_TABLET | Freq: Every day | ORAL | 0 refills | Status: AC
Start: 2024-05-27 — End: ?

## 2024-05-27 NOTE — Discharge Instructions (Signed)
 Your evaluated for itching, at this time no rash on the skin, unknown cause but could be hormonal related as there has been a pattern  You have been given an injection of steroids to reduce the reaction process and ideally clear symptoms  Starting tomorrow take oral prednisone as directed, take with food  You may use hydroxyzine every 6 hours as needed for itching, may use topical Benadryl, calamine lotion and use oatmeal soap to further soothe the skin  Please schedule follow-up appointment with your primary doctor as they may need to complete lab work for hormone testing  You may also follow-up with the allergist as symptoms have occurred twice

## 2024-05-27 NOTE — ED Provider Notes (Signed)
 [name]    CSN: [id number] Arrival date & time: 05/27/24  5366      History   Chief Complaint No chief complaint on file.   HPI Roberta Dean is a 43 y.o. female.   Patient presents for evaluation of generalized pruritus beginning 6 days ago.  Started abruptly, denies changes in toiletries, diet or recent travel.  Did eat shrimp 3 days prior to symptoms beginning but has eaten before without complication.  Endorses that symptoms occurred once prior last month before menstruation and resolved after, denies change in timeframe and amount of bleeding but endorses monthly menses has moved dates.  Due for menstruation now.  Has been using Benadryl without relief.  Denies respiratory involvement.  Past Medical History:  Diagnosis Date   Attention to urostomy [facility])    GERD (gastroesophageal reflux disease)    Interstitial cystitis    Multiple sclerosis (HCC)    PONV (postoperative nausea and vomiting)    Red blood cell antibody positive     There are no active problems to display for this patient.   Past Surgical History:  Procedure Laterality Date   CHOLECYSTECTOMY     CYSTECTOMY W/ URETEROILEAL CONDUIT  07/2017   [facility]   CYSTOSCOPY W/ FULGURATION / EVACUATION OF CLOTS     ESOPHAGOGASTRODUODENOSCOPY (EGD) WITH PROPOFOL  N/A 08/07/2021   Procedure: ESOPHAGOGASTRODUODENOSCOPY (EGD) WITH PROPOFOL ;  Surgeon: Shane Darling, MD;  Location: [facility] ENDOSCOPY;  Service: Endoscopy;  Laterality: N/A;   OVARIAN CYST DRAINAGE      OB History   No obstetric history on file.      Home Medications    Prior to Admission medications   Medication Sig Start Date End Date Taking? Authorizing Provider  hydrOXYzine (ATARAX) 25 MG tablet Take 1 tablet (25 mg total) by mouth every 6 (six) hours. 05/27/24  Yes Vinessa Macconnell R, NP  ibuprofen (ADVIL) 200 MG tablet Take 400 mg by mouth every 6 (six) hours as needed for fever, mild pain (pain score  1-3) or moderate pain (pain score 4-6).   Yes [provider]  KESIMPTA 20 MG/0.4ML SOAJ Inject 40 mg into the skin every 30 (thirty) days. 05/10/22  Yes [provider]  linaclotide (LINZESS) 72 MCG capsule Take 72 mcg by mouth daily before breakfast.   Yes [provider]  predniSONE (STERAPRED UNI-PAK 21 TAB) 10 MG (21) TBPK tablet Take by mouth daily. Take 6 tabs by mouth daily  for 1 days, then 5 tabs for 1 days, then 4 tabs for 1 days, then 3 tabs for 1 days, 2 tabs for 1 days, then 1 tab by mouth daily for 1 days 05/27/24  Yes Karyn Brull R, NP  omeprazole (PRILOSEC) 20 MG capsule Take 20 mg by mouth daily.    [provider]  promethazine (PHENERGAN) 25 MG tablet Take 25 mg by mouth every 6 (six) hours as needed.    [provider]    Family History Family History  Problem Relation Age of Onset   Hypertension Mother    Lung cancer Father 26    Social History Social History   Tobacco Use   Smoking status: Never   Smokeless tobacco: Never  Vaping Use   Vaping status: Never Used  Substance Use Topics   Alcohol use: Never   Drug use: Never     Allergies   Amoxicillin   Review of Systems Review of Systems   Physical Exam Triage Vital Signs  ED Triage Vitals  Encounter Vitals Group     BP 05/27/24 0840 109/71     Systolic BP Percentile --      Diastolic BP Percentile --      Pulse Rate 05/27/24 0840 80     Resp 05/27/24 0840 16     Temp 05/27/24 0840 98.1 F (36.7 C)     Temp Source 05/27/24 0840 Oral     SpO2 05/27/24 0840 100 %     Weight --      Height --      Head Circumference --      Peak Flow --      Pain Score 05/27/24 0836 0     Pain Loc --      Pain Education --      Exclude from Growth Chart --    No data found.  Updated Vital Signs BP 109/71 (BP Location: Right Arm)   Pulse 80   Temp 98.1 F (36.7 C) (Oral)   Resp 16   LMP 04/25/2024 (Approximate)   SpO2 100%   Visual Acuity Right Eye  Distance:   Left Eye Distance:   Bilateral Distance:    Right Eye Near:   Left Eye Near:    Bilateral Near:     Physical Exam Constitutional:      Appearance: Normal appearance.  Eyes:     Extraocular Movements: Extraocular movements intact.  Pulmonary:     Effort: Pulmonary effort is normal.  Skin:    Comments: No abnormality to the skin   Neurological:     Mental Status: She is alert and oriented to person, place, and time. Mental status is at baseline.      UC Treatments / Results  Labs (all labs ordered are listed, but only abnormal results are displayed) Labs Reviewed - No data to display  EKG   Radiology No results found.  Procedures Procedures (including critical care time)  Medications Ordered in UC Medications  methylPREDNISolone acetate (DEPO-MEDROL) injection 60 mg (60 mg Intramuscular Given 05/27/24 0906)    Initial Impression / Assessment and Plan / UC Course  I have reviewed the triage vital signs and the nursing notes.  Pertinent labs & imaging results that were available during my care of the patient were reviewed by me and considered in my medical decision making (see chart for details).  Pruritus  Unknown etiology, could be hormonal as occurring prior to menstruation, discussed this with patient low suspicion for new shellfish allergy, no signs of infection, methylprednisolone IM given and prescribed prednisone for home use as well as hydroxyzine for pruritus, recommended additional supportive care advised PCP follow-up as she may need further lab work and testing as well as given referral to allergist Final Clinical Impressions(s) / UC Diagnoses   Final diagnoses:  Pruritus   Discharge Instructions      Your evaluated for itching, at this time no rash on the skin, unknown cause but could be hormonal related as there has been a pattern  You have been given an injection of steroids to reduce the reaction process and ideally clear  symptoms  Starting tomorrow take oral prednisone as directed, take with food  You may use hydroxyzine every 6 hours as needed for itching, may use topical Benadryl, calamine lotion and use oatmeal soap to further soothe the skin  Please schedule follow-up appointment with your primary doctor as they may need to complete lab work for hormone testing  You may also follow-up with  the allergist as symptoms have occurred twice  ED Prescriptions     Medication Sig Dispense Auth. Provider   predniSONE (STERAPRED UNI-PAK 21 TAB) 10 MG (21) TBPK tablet Take by mouth daily. Take 6 tabs by mouth daily  for 1 days, then 5 tabs for 1 days, then 4 tabs for 1 days, then 3 tabs for 1 days, 2 tabs for 1 days, then 1 tab by mouth daily for 1 days 21 tablet Shemeka Wardle R, NP   hydrOXYzine (ATARAX) 25 MG tablet Take 1 tablet (25 mg total) by mouth every 6 (six) hours. 12 tablet Newt Levingston R, NP      PDMP not reviewed this encounter.   Reena Canning, [place] 05/27/24 [phone]

## 2024-05-27 NOTE — ED Triage Notes (Signed)
 Pt presents to UC for c/o itching all over x6 days.  Ate shrimp 3 days in a row. Benadryl not helping.

## 2025-01-06 ENCOUNTER — Other Ambulatory Visit: Payer: Self-pay

## 2025-01-06 ENCOUNTER — Emergency Department: Admission: EM | Admit: 2025-01-06 | Discharge: 2025-01-06 | Disposition: A | Discharge status: Home / Self Care

## 2025-01-06 DIAGNOSIS — U071 COVID-19: Secondary | ICD-10-CM | POA: Diagnosis not present

## 2025-01-06 DIAGNOSIS — R509 Fever, unspecified: Secondary | ICD-10-CM | POA: Diagnosis present

## 2025-01-06 DIAGNOSIS — R6889 Other general symptoms and signs: Secondary | ICD-10-CM

## 2025-01-06 LAB — RESP PANEL BY RT-PCR (RSV, FLU A&B, COVID)  RVPGX2
Influenza A by PCR: NEGATIVE
Influenza B by PCR: NEGATIVE
Resp Syncytial Virus by PCR: NEGATIVE
SARS Coronavirus 2 by RT PCR: POSITIVE — AB

## 2025-01-06 MED ORDER — OSELTAMIVIR PHOSPHATE 75 MG PO CAPS: 75.0000 mg | ORAL_CAPSULE | Freq: Two times a day (BID) | ORAL | 0 refills | Status: AC

## 2025-01-06 NOTE — ED Provider Notes (Signed)
 "  [facility] Provider Note    Event Date/Time   First MD Initiated Contact with Patient 01/06/25 [phone]     (approximate)   History   Nasal Congestion   HPI  Roberta Dean is a 44 y.o. female history of multiple sclerosis and interstitial cystitis presenting with concern of nasal congestion fever fatigue.  Took Motrin about 11 PM last night.  Presents with concern of flulike symptoms, daughter sick with similar symptoms, patient works as a chartered loss adjuster.  Complaining primarily of congestion and rhinorrhea.  Has not gotten her flu vaccine this year.  No chest pain shortness of breath productive cough or any other symptoms.  She is on kesimpta for her multiple sclerosis.     Physical Exam   Triage Vital Signs: ED Triage Vitals  Encounter Vitals Group     BP 01/06/25 0614 123/75     Girls Systolic BP Percentile --      Girls Diastolic BP Percentile --      Boys Systolic BP Percentile --      Boys Diastolic BP Percentile --      Pulse Rate 01/06/25 0614 85     Resp 01/06/25 0614 18     Temp 01/06/25 0614 99.5 F (37.5 C)     Temp Source 01/06/25 0614 Oral     SpO2 01/06/25 0614 100 %     Weight 01/06/25 0613 119 lb (54 kg)     Height 01/06/25 0613 5' 1 (1.549 m)     Head Circumference --      Peak Flow --      Pain Score 01/06/25 0613 8     Pain Loc --      Pain Education --      Exclude from Growth Chart --     Most recent vital signs: Vitals:   01/06/25 0614  BP: 123/75  Pulse: 85  Resp: 18  Temp: 99.5 F (37.5 C)  SpO2: 100%     General: Awake, no distress.  CV:  Good peripheral perfusion.  Resp:  Normal effort.  Clear to auscultation bilaterally Abd:  No distention.  Soft nontender Other:     ED Results / Procedures / Treatments   Labs (all labs ordered are listed, but only abnormal results are displayed) Labs Reviewed  RESP PANEL BY RT-PCR (RSV, FLU A&B, COVID)  [id number]      EKG     RADIOLOGY   PROCEDURES:  Critical Care performed: No  Procedures   MEDICATIONS ORDERED IN ED: Medications - No data to display   IMPRESSION / MDM / ASSESSMENT AND PLAN / ED COURSE  I reviewed the triage vital signs and the nursing notes.                               Patient's presentation is most consistent with acute complicated illness / injury requiring diagnostic workup.  44 year old female with underlying multiple sclerosis presenting today with concern of flulike illness.  Daughter sick with similar symptoms multiple students at school sick with similar symptoms.  She is currently immunosuppressed, complaining of fever at home.  She appears well she is not in any acute distress she has no complaints of chest pain shortness of breath, primarily nasal congestion.  Given the clinical appearance, I am obtaining a viral panel, but at this time feel reasonable for discharge patient is amenable to follow-up results of this on MyChart.  I have written for course of Tamiflu  for her, I instructed her to pick up the Tamiflu  if the flu test is positive, if it is negative she can continue symptomatic management.  I discussed return precautions, she verbalized understanding and is agreeable with the plan.       FINAL CLINICAL IMPRESSION(S) / ED DIAGNOSES   Final diagnoses:  Flu-like symptoms     Rx / DC Orders   ED Discharge Orders          Ordered    oseltamivir  (TAMIFLU ) 75 MG capsule  2 times daily        01/06/25 9370             Note:  This document was prepared using Dragon voice recognition software and may include unintentional dictation errors.   Fernand Rossie HERO, MD 01/06/25 [phone]  "

## 2025-01-06 NOTE — ED Triage Notes (Signed)
 Pt presents to the ED via POV with complaints of flu-like sx including cough, nasal congestion, and scratchy throat. A&Ox4 at this time. Denies CP or SOB.

## 2025-01-06 NOTE — Discharge Instructions (Signed)
 You were seen today due to concern of flulike symptoms.  We have sent a flu test for you at this time, and I have also written for some medications if your flu test comes back positive.  If it comes back negative please do not take this medication you likely have a viral infection.  Please be sure to drink plenty water and take Tylenol  and ibuprofen at home to help manage your symptoms.  If you start having chest pain, shortness of breath, lightheadedness, or any other symptoms you find concerning please return to the emergency department immediately for further medical management.
